# Patient Record
Sex: Male | Born: 1970 | Race: Black or African American | Hispanic: No | Marital: Single | Smoking: Former smoker
Health system: Southern US, Community
[De-identification: ages and names within clinical notes are randomized; demographics above are authoritative.]

## PROBLEM LIST (undated history)

## (undated) DIAGNOSIS — M549 Dorsalgia, unspecified: Secondary | ICD-10-CM

## (undated) DIAGNOSIS — G43909 Migraine, unspecified, not intractable, without status migrainosus: Secondary | ICD-10-CM

## (undated) DIAGNOSIS — J45909 Unspecified asthma, uncomplicated: Secondary | ICD-10-CM

## (undated) DIAGNOSIS — I10 Essential (primary) hypertension: Secondary | ICD-10-CM

## (undated) DIAGNOSIS — I639 Cerebral infarction, unspecified: Secondary | ICD-10-CM

## (undated) DIAGNOSIS — G8929 Other chronic pain: Secondary | ICD-10-CM

## (undated) DIAGNOSIS — I729 Aneurysm of unspecified site: Secondary | ICD-10-CM

## (undated) DIAGNOSIS — E7800 Pure hypercholesterolemia, unspecified: Secondary | ICD-10-CM

## (undated) HISTORY — PX: BACK SURGERY: SHX140

---

## 2011-07-30 ENCOUNTER — Encounter: Payer: Self-pay | Attending: Physical Medicine & Rehabilitation

## 2011-07-30 ENCOUNTER — Ambulatory Visit: Payer: Self-pay | Admitting: Physical Medicine & Rehabilitation

## 2012-01-23 ENCOUNTER — Encounter (HOSPITAL_COMMUNITY): Payer: Self-pay | Admitting: *Deleted

## 2012-01-23 ENCOUNTER — Emergency Department (HOSPITAL_COMMUNITY)
Admission: EM | Admit: 2012-01-23 | Discharge: 2012-01-23 | Disposition: A | Discharge status: Home / Self Care | Payer: Self-pay | Attending: Emergency Medicine | Admitting: Emergency Medicine

## 2012-01-23 DIAGNOSIS — M549 Dorsalgia, unspecified: Secondary | ICD-10-CM

## 2012-01-23 DIAGNOSIS — M545 Low back pain, unspecified: Secondary | ICD-10-CM | POA: Insufficient documentation

## 2012-01-23 DIAGNOSIS — Z8673 Personal history of transient ischemic attack (TIA), and cerebral infarction without residual deficits: Secondary | ICD-10-CM | POA: Insufficient documentation

## 2012-01-23 DIAGNOSIS — G8929 Other chronic pain: Secondary | ICD-10-CM | POA: Insufficient documentation

## 2012-01-23 DIAGNOSIS — Z87891 Personal history of nicotine dependence: Secondary | ICD-10-CM | POA: Insufficient documentation

## 2012-01-23 DIAGNOSIS — E7800 Pure hypercholesterolemia, unspecified: Secondary | ICD-10-CM | POA: Insufficient documentation

## 2012-01-23 DIAGNOSIS — I10 Essential (primary) hypertension: Secondary | ICD-10-CM | POA: Insufficient documentation

## 2012-01-23 DIAGNOSIS — J45909 Unspecified asthma, uncomplicated: Secondary | ICD-10-CM | POA: Insufficient documentation

## 2012-01-23 HISTORY — DX: Cerebral infarction, unspecified: I63.9

## 2012-01-23 HISTORY — DX: Migraine, unspecified, not intractable, without status migrainosus: G43.909

## 2012-01-23 HISTORY — DX: Unspecified asthma, uncomplicated: J45.909

## 2012-01-23 HISTORY — DX: Essential (primary) hypertension: I10

## 2012-01-23 HISTORY — DX: Pure hypercholesterolemia, unspecified: E78.00

## 2012-01-23 MED ORDER — HYDROCODONE-ACETAMINOPHEN 5-325 MG PO TABS: 2.0000 | ORAL_TABLET | Freq: Three times a day (TID) | ORAL | Status: AC | PRN

## 2012-01-23 MED ORDER — HYDROCODONE-ACETAMINOPHEN 5-325 MG PO TABS
1.0000 | ORAL_TABLET | Freq: Once | ORAL | Status: AC
  Administered 2012-01-23: 1 via ORAL
  Filled 2012-01-23: qty 1

## 2012-01-23 MED ORDER — DIAZEPAM 5 MG PO TABS: 5.0000 mg | ORAL_TABLET | Freq: Two times a day (BID) | ORAL | Status: AC

## 2012-01-23 MED ORDER — IBUPROFEN 800 MG PO TABS: 800.0000 mg | ORAL_TABLET | Freq: Three times a day (TID) | ORAL | Status: AC

## 2012-01-23 MED ORDER — DIAZEPAM 5 MG PO TABS
5.0000 mg | ORAL_TABLET | Freq: Once | ORAL | Status: AC
  Administered 2012-01-23: 5 mg via ORAL
  Filled 2012-01-23: qty 1

## 2012-01-23 MED ORDER — IBUPROFEN 400 MG PO TABS
800.0000 mg | ORAL_TABLET | Freq: Once | ORAL | Status: AC
  Administered 2012-01-23: 800 mg via ORAL
  Filled 2012-01-23: qty 2

## 2012-01-23 NOTE — ED Notes (Signed)
PT has had 2 back surgeries in lower back and in 2011 had stroke and aneurysm.  Pt states lower back pain has been worse over last 2 days and pain is running down legs.  Ambulatory.  No incontinence of bowel or bladder

## 2012-01-23 NOTE — ED Provider Notes (Signed)
History  Scribed for Gerhard Munch, MD, the patient was seen in room TR10C/TR10C. This chart was scribed by Candelaria Stagers. The patient's care started at 6:10 PM   CSN: [id number]  Arrival date & time 01/23/12  1640   First MD Initiated Contact with Patient 01/23/12 1755      Chief Complaint  Patient presents with  . Back Pain     The history is provided by the patient.   John Duffy is a 41 y.o. male who presents to the Emergency Department complaining of chronic lower back pain that became worse two days ago which is  radiating down both legs.  Pt denies new injury or trauma.  He has no incontinence of bowel or bladder, numbness, tingling, fever, or chills.  Pt has h/o 2 back surgeries, and stroke and aneurysm in 2011.  Pt has taken tramadol for the back pain with no relief.    Past Medical History  Diagnosis Date  . Stroke     brain aneurysm  . Hypertension   . Migraines   . Hypercholesteremia   . Asthma     Past Surgical History  Procedure Date  . Back surgery     No family history on file.  History  Substance Use Topics  . Smoking status: Former Games developer  . Smokeless tobacco: Not on file  . Alcohol Use: No      Review of Systems  Constitutional: Negative for fever and chills.       Per HPI, otherwise negative  HENT:       Per HPI, otherwise negative  Eyes: Negative.   Respiratory:       Per HPI, otherwise negative  Cardiovascular:       Per HPI, otherwise negative  Gastrointestinal: Negative for vomiting.  Genitourinary: Negative.  Negative for difficulty urinating.  Musculoskeletal: Positive for back pain.       Per HPI, otherwise negative  Skin: Negative.   Neurological: Negative for syncope, weakness and numbness.    Allergies  Review of patient's allergies indicates no known allergies.  Home Medications   Current Outpatient Rx  Name Route Sig Dispense Refill  . ASPIRIN EC 81 MG PO TBEC Oral Take 81 mg by mouth daily.    Marland Kitchen BACLOFEN 10 MG  PO TABS Oral Take 10 mg by mouth 3 (three) times daily.    Marland Kitchen LISINOPRIL-HYDROCHLOROTHIAZIDE 20-12.5 MG PO TABS Oral Take 1 tablet by mouth daily.    . TRAMADOL-ACETAMINOPHEN 37.5-325 MG PO TABS Oral Take 1 tablet by mouth every 6 (six) hours as needed. For pain      BP 160/114  Pulse 81  Temp 98.6 F (37 C) (Oral)  Resp 18  SpO2 99%  Physical Exam  Nursing note and vitals reviewed. Constitutional: He is oriented to person, place, and time. He appears well-developed. No distress.  HENT:  Head: Normocephalic and atraumatic.  Eyes: Conjunctivae and EOM are normal.  Cardiovascular: Normal rate and regular rhythm.   Pulmonary/Chest: Effort normal. No stridor. No respiratory distress.  Abdominal: He exhibits no distension.  Musculoskeletal: He exhibits no edema.       Mid lumbar tenderness.  Bilateral SI joint pain without deformity.  Strength of lower extremities intact.    Neurological: He is alert and oriented to person, place, and time.  Skin: Skin is warm and dry.  Psychiatric: He has a normal mood and affect.    ED Course  Procedures   DIAGNOSTIC STUDIES: Oxygen Saturation is 99% on  room air, normal by my interpretation.    COORDINATION OF CARE:     Labs Reviewed - No data to display No results found.   No diagnosis found.    MDM  I personally performed the services described in this documentation, which was scribed in my presence. The recorded information has been reviewed and considered.  This generally well-appearing male with a history of multiple lower spine procedures now presents with concerns of increasing low back pain with radiation down both legs.  On exam he is in no distress, with no focal neurologic deficiencies, and with vital signs notable only for mild hypertension.  The patient's history of chronic back pain, the preserved neurovascular status is reassuring.  He was provided an initial dose of analgesics, and discharged in stable condition to follow  up with his orthopedist.  Gerhard Munch, MD 01/23/12 1845

## 2012-07-30 ENCOUNTER — Emergency Department (HOSPITAL_COMMUNITY): Payer: Medicaid Other

## 2012-07-30 ENCOUNTER — Encounter (HOSPITAL_COMMUNITY): Payer: Self-pay | Admitting: Emergency Medicine

## 2012-07-30 ENCOUNTER — Emergency Department (HOSPITAL_COMMUNITY)
Admission: EM | Admit: 2012-07-30 | Discharge: 2012-07-30 | Disposition: A | Discharge status: Home Health | Payer: Medicaid Other | Attending: Emergency Medicine | Admitting: Emergency Medicine

## 2012-07-30 DIAGNOSIS — I10 Essential (primary) hypertension: Secondary | ICD-10-CM | POA: Insufficient documentation

## 2012-07-30 DIAGNOSIS — IMO0002 Reserved for concepts with insufficient information to code with codable children: Secondary | ICD-10-CM | POA: Insufficient documentation

## 2012-07-30 DIAGNOSIS — W010XXA Fall on same level from slipping, tripping and stumbling without subsequent striking against object, initial encounter: Secondary | ICD-10-CM | POA: Insufficient documentation

## 2012-07-30 DIAGNOSIS — Y939 Activity, unspecified: Secondary | ICD-10-CM | POA: Insufficient documentation

## 2012-07-30 DIAGNOSIS — Z87891 Personal history of nicotine dependence: Secondary | ICD-10-CM | POA: Insufficient documentation

## 2012-07-30 DIAGNOSIS — Z7982 Long term (current) use of aspirin: Secondary | ICD-10-CM | POA: Insufficient documentation

## 2012-07-30 DIAGNOSIS — Z8679 Personal history of other diseases of the circulatory system: Secondary | ICD-10-CM | POA: Insufficient documentation

## 2012-07-30 DIAGNOSIS — M549 Dorsalgia, unspecified: Secondary | ICD-10-CM

## 2012-07-30 DIAGNOSIS — Z79899 Other long term (current) drug therapy: Secondary | ICD-10-CM | POA: Insufficient documentation

## 2012-07-30 DIAGNOSIS — Z8673 Personal history of transient ischemic attack (TIA), and cerebral infarction without residual deficits: Secondary | ICD-10-CM | POA: Insufficient documentation

## 2012-07-30 DIAGNOSIS — E7800 Pure hypercholesterolemia, unspecified: Secondary | ICD-10-CM | POA: Insufficient documentation

## 2012-07-30 DIAGNOSIS — Y929 Unspecified place or not applicable: Secondary | ICD-10-CM | POA: Insufficient documentation

## 2012-07-30 DIAGNOSIS — J45909 Unspecified asthma, uncomplicated: Secondary | ICD-10-CM | POA: Insufficient documentation

## 2012-07-30 MED ORDER — HYDROCODONE-ACETAMINOPHEN 5-325 MG PO TABS: 2.0000 | ORAL_TABLET | ORAL | Status: DC | PRN

## 2012-07-30 MED ORDER — KETOROLAC TROMETHAMINE 60 MG/2ML IM SOLN
60.0000 mg | Freq: Once | INTRAMUSCULAR | Status: AC
  Administered 2012-07-30: 60 mg via INTRAMUSCULAR
  Filled 2012-07-30: qty 2

## 2012-07-30 MED ORDER — METHOCARBAMOL 500 MG PO TABS: 500.0000 mg | ORAL_TABLET | Freq: Two times a day (BID) | ORAL | Status: DC

## 2012-07-30 NOTE — ED Provider Notes (Signed)
History     CSN: [id number]  Arrival date & time 07/30/12  7829   First MD Initiated Contact with Patient 07/30/12 [phone]      Chief Complaint  Patient presents with  . Fall  . Back Pain    (Consider location/radiation/quality/duration/timing/severity/associated sxs/prior treatment) HPI Comments: This is a 42 year old male, who presents emergency department with chief complaint low back pain. Patient states that he slipped and fell on Friday. States that he is having back pain, that is radiating down his legs. He denies any bowel or bladder incontinence. He denies any saddle anesthesia. He has not tried taking anything to alleviate his symptoms. Bending and movement makes the patient's symptoms worse, nothing makes it better. States that his pain is moderate to severe.  The history is provided by the patient. No language interpreter was used.    Past Medical History  Diagnosis Date  . Stroke     brain aneurysm  . Hypertension   . Migraines   . Hypercholesteremia   . Asthma     Past Surgical History  Procedure Laterality Date  . Back surgery      No family history on file.  History  Substance Use Topics  . Smoking status: Former Games developer  . Smokeless tobacco: Not on file  . Alcohol Use: No      Review of Systems  All other systems reviewed and are negative.    Allergies  Review of patient's allergies indicates no known allergies.  Home Medications   Current Outpatient Rx  Name  Route  Sig  Dispense  Refill  . aspirin 81 MG chewable tablet   Oral   Chew 81 mg by mouth daily with breakfast.         . atorvastatin (LIPITOR) 10 MG tablet   Oral   Take 10 mg by mouth daily.         . Nebivolol HCl (BYSTOLIC PO)   Oral   Take 1 tablet by mouth daily with breakfast.         . HYDROcodone-acetaminophen (NORCO/VICODIN) 5-325 MG per tablet   Oral   Take 2 tablets by mouth every 4 (four) hours as needed for pain.   10 tablet   0   . methocarbamol  (ROBAXIN) 500 MG tablet   Oral   Take 1 tablet (500 mg total) by mouth 2 (two) times daily.   20 tablet   0     BP 122/91  Pulse 67  Temp(Src) 98.2 F (36.8 C) (Oral)  Resp 20  SpO2 100%  Physical Exam  Nursing note and vitals reviewed. Constitutional: He is oriented to person, place, and time. He appears well-developed and well-nourished.  HENT:  Head: Normocephalic and atraumatic.  Neck: Normal range of motion. Neck supple. No JVD present.  Cardiovascular: Normal rate, regular rhythm, normal heart sounds and intact distal pulses.  Exam reveals no gallop and no friction rub.   No murmur heard. Pulmonary/Chest: Effort normal and breath sounds normal. No respiratory distress. He has no wheezes. He has no rales. He exhibits no tenderness.  Abdominal: Soft. Bowel sounds are normal. He exhibits no distension and no mass. There is no tenderness. There is no rebound and no guarding.  Musculoskeletal: Normal range of motion. He exhibits tenderness. He exhibits no edema.  Lumbar spine and lumbar paraspinal muscles are moderately tender to palpation, no gross abnormality or deformity  Neurological: He is alert and oriented to person, place, and time.  Skin: Skin is  warm and dry.  Psychiatric: He has a normal mood and affect. His behavior is normal. Judgment and thought content normal.    ED Course  Procedures (including critical care time)  Labs Reviewed - No data to display Dg Lumbar Spine Complete  07/30/2012  *RADIOLOGY REPORT*  Clinical Data: Fall with low back pain.  LUMBAR SPINE - COMPLETE 4+ VIEW  Comparison: None  Findings: Five non-rib bearing lumbar vertebra are identified in normal alignment. There is no evidence of fracture or subluxation. Mild degenerative disc disease at L5-S1 noted. The disc spaces are otherwise maintained. No focal bony lesions or spondylolysis identified.  IMPRESSION: No evidence of acute bony abnormality.  Mild L5-S1 degenerative disc disease.   Original  Report Authenticated By: Harmon Pier, M.D.    Dg Sacrum/coccyx  07/30/2012  *RADIOLOGY REPORT*  Clinical Data: Sacrum and coccyx pain following fall.  SACRUM AND COCCYX - 2+ VIEW  Comparison: None  Findings: There is no evidence of fracture. No focal bony lesions are present. No bony abnormalities are present.  IMPRESSION: No evidence of acute bony abnormality.   Original Report Authenticated By: Harmon Pier, M.D.      1. Back pain       MDM  42 year old male with back pain. Patient given a portal shot in the emergency department. Patient states that his pain has improved significantly while in the emergency department. Will discharge the patient with pain medicine and Robaxin. Will have the patient followup with his primary care provider. Doubt any cauda equina, or other serious back problems at this time. Patient is stable and ready for discharge.        Roxy Horseman, PA-C 07/30/12 1233

## 2012-07-30 NOTE — ED Notes (Signed)
Pt discharged to home with family. NAD.  

## 2012-07-30 NOTE — ED Provider Notes (Signed)
Medical screening examination/treatment/procedure(s) were performed by non-physician practitioner and as supervising physician I was immediately available for consultation/collaboration.  Marwan T Powers, MD 07/30/12 1511 

## 2012-07-30 NOTE — ED Notes (Signed)
Pt slipped and fell Friday. Pt c/o low back pain that radiates down both legs. Pt denies +LOC.

## 2012-08-07 ENCOUNTER — Encounter: Payer: Self-pay | Admitting: Physical Medicine & Rehabilitation

## 2012-08-22 ENCOUNTER — Encounter: Payer: Medicaid Other | Attending: Physical Medicine & Rehabilitation

## 2012-08-22 ENCOUNTER — Ambulatory Visit (HOSPITAL_BASED_OUTPATIENT_CLINIC_OR_DEPARTMENT_OTHER): Payer: Medicaid Other | Admitting: Physical Medicine & Rehabilitation

## 2012-08-22 ENCOUNTER — Encounter: Payer: Self-pay | Admitting: Physical Medicine & Rehabilitation

## 2012-08-22 VITALS — BP 171/108 | HR 81 | Resp 14 | Ht 69.0 in | Wt 182.0 lb

## 2012-08-22 DIAGNOSIS — M549 Dorsalgia, unspecified: Secondary | ICD-10-CM

## 2012-08-22 DIAGNOSIS — Z5181 Encounter for therapeutic drug level monitoring: Secondary | ICD-10-CM

## 2012-08-22 DIAGNOSIS — M961 Postlaminectomy syndrome, not elsewhere classified: Secondary | ICD-10-CM

## 2012-08-22 DIAGNOSIS — M47817 Spondylosis without myelopathy or radiculopathy, lumbosacral region: Secondary | ICD-10-CM

## 2012-08-22 DIAGNOSIS — M79609 Pain in unspecified limb: Secondary | ICD-10-CM

## 2012-08-22 DIAGNOSIS — G8928 Other chronic postprocedural pain: Secondary | ICD-10-CM | POA: Insufficient documentation

## 2012-08-22 MED ORDER — BACLOFEN 10 MG PO TABS: 10.0000 mg | ORAL_TABLET | Freq: Three times a day (TID) | ORAL | Status: DC

## 2012-08-22 MED ORDER — MELOXICAM 7.5 MG PO TABS: 7.5000 mg | ORAL_TABLET | Freq: Every day | ORAL | Status: DC

## 2012-08-22 NOTE — Progress Notes (Signed)
Subjective:    Patient ID: John Duffy, male    DOB: Feb 25, 1971, 42 y.o.   MRN: [id number]  HPI Chief complaint back pain and left lower extremity pain, work related injury, had PT and surgery, June 2013  Most recent MRI from 2013 showed postsurgical changes degenerative changes small right-sided paracentral protrusion which may be scar possible right S1 encroachment. Date of MRI 04/06/2012 also showed facet hypertrophy, arthritic changes mild at L2-3 mild L3-4 mild at L4-5 and moderate at L5-S1 note reviewed from PCP, notes from [facility]. Past surgical history laminotomy 11/30/2010 L5-S1 as well as nerve root decompression partial facetectomy and partial disc removal PMH: Aneurysm rupture 2011 resulting in right-sided weakness upper extremity greater than lower also with right eye watering, Hypertension poorly controlled Opioid risk total score of 10 which is elevated with high likelihood of problems with chronic opioid analgesics Pain Inventory Average Pain 10 Pain Right Now 8 My pain is sharp, stabbing, tingling and aching  In the last 24 hours, has pain interfered with the following? General activity 10 Relation with others 10 Enjoyment of life 10 What TIME of day is your pain at its worst? morning Sleep (in general) Poor  Pain is worse with: walking, bending, sitting, inactivity, standing and some activites Pain improves with: rest and medication Relief from Meds: 10  Mobility use a cane how many minutes can you walk? 3 ability to climb steps?  yes do you drive?  no Do you have any goals in this area?  yes  Function disabled: date disabled 2011 I need assistance with the following:  bathing, household duties and shopping  Neuro/Psych weakness trouble walking spasms depression  Prior Studies CT/MRI  Physicians involved in your care Primary care [facility]   Family History  Problem Relation Age of Onset  . Diabetes  Mother   . Hypertension Mother    History   Social History  . Marital Status: Single    Spouse Name: N/A    Number of Children: N/A  . Years of Education: N/A   Social History Main Topics  . Smoking status: Former Games developer  . Smokeless tobacco: None  . Alcohol Use: No  . Drug Use: No  . Sexually Active:    Other Topics Concern  . None   Social History Narrative  . None   Past Surgical History  Procedure Laterality Date  . Back surgery     Past Medical History  Diagnosis Date  . Stroke     brain aneurysm  . Hypertension   . Migraines   . Hypercholesteremia   . Asthma    BP 167/120  Pulse 81  Resp 14  Ht 5\' 9"  (1.753 m)  Wt 182 lb (82.555 kg)  BMI 26.86 kg/m2  SpO2 97%     Review of Systems  Musculoskeletal: Positive for back pain and gait problem.  Neurological: Positive for weakness.  Psychiatric/Behavioral: Positive for dysphoric mood.  All other systems reviewed and are negative.       Objective:   Physical Exam  Nursing note and vitals reviewed. Constitutional: He is oriented to person, place, and time. He appears well-developed and well-nourished.  HENT:  Head: Normocephalic and atraumatic.  Eyes: Conjunctivae and EOM are normal. Pupils are equal, round, and reactive to light.  Neck: Normal range of motion.  Cardiovascular: Normal rate, regular rhythm, normal heart sounds and intact distal pulses.   Pulmonary/Chest: Effort normal and breath sounds normal.  Abdominal: Soft. Bowel sounds  are normal.  Musculoskeletal:       Lumbar back: He exhibits decreased range of motion, tenderness, pain and spasm. He exhibits no swelling, no edema, no deformity and no laceration.  Midline surgical scar well-healed in the lumbar area Limited range of motion in all directions in the lumbar spine  Neurological: He is alert and oriented to person, place, and time. He has normal reflexes.  Psychiatric: His affect is blunt.  Positive Waddell sign with axial  pressure on head as well as pain with minimal palpation of the lumbar area          Assessment & Plan:  1. 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 This is a mixture between disc pain, muscle pain from scarring. There is some residual radicular discomfort but nothing on the most recent MRI to indicate a new disc that can be causing his left leg symptoms. 2. Lumbar spondylosis evidence of arthritis in the lumbar area that can be contributing to his back pain. Will refer to physical therapy Will schedule lumbar medial branch blocks Will increase baclofen to 10 mg 3 times a day he only takes twice a day,If he fails this may try Zanaflex Add Mobic Nonnarcotic treatment secondary to elevated opioid risk tool score Recheck blood pressure if still elevated call PCP

## 2012-08-22 NOTE — Patient Instructions (Addendum)
Referral to physical therapy Schedule for lumbar injections to help with arthritis Recheck blood pressure still up 171/108 call your  physician Increase baclofen to 3 times a day Start Mobic 7.5 mg a day

## 2012-08-24 ENCOUNTER — Telehealth: Payer: Self-pay

## 2012-08-24 NOTE — Telephone Encounter (Signed)
[name] patients PCP called to get update on appointment and treatment.  Please call her.

## 2012-08-24 NOTE — Telephone Encounter (Signed)
[name] called to let us know that she refilled patients percocet.  This is the last time she says she will fill this.  Patient is aware he will be non-narcotic with our office.

## 2012-08-24 NOTE — Telephone Encounter (Signed)
Left message for John Duffy to call regarding her questions regarding patient.

## 2012-09-14 ENCOUNTER — Ambulatory Visit: Payer: Medicaid Other | Admitting: Physical Medicine & Rehabilitation

## 2012-09-14 ENCOUNTER — Encounter: Payer: Medicaid Other | Attending: Physical Medicine & Rehabilitation

## 2012-09-20 ENCOUNTER — Other Ambulatory Visit: Payer: Self-pay | Admitting: Otolaryngology

## 2013-04-28 ENCOUNTER — Emergency Department (HOSPITAL_COMMUNITY)
Admission: EM | Admit: 2013-04-28 | Discharge: 2013-04-28 | Disposition: A | Discharge status: Home / Self Care | Payer: Medicare PPO | Attending: Emergency Medicine | Admitting: Emergency Medicine

## 2013-04-28 ENCOUNTER — Encounter (HOSPITAL_COMMUNITY): Payer: Self-pay | Admitting: Emergency Medicine

## 2013-04-28 DIAGNOSIS — IMO0002 Reserved for concepts with insufficient information to code with codable children: Secondary | ICD-10-CM | POA: Insufficient documentation

## 2013-04-28 DIAGNOSIS — G43909 Migraine, unspecified, not intractable, without status migrainosus: Secondary | ICD-10-CM | POA: Insufficient documentation

## 2013-04-28 DIAGNOSIS — J45909 Unspecified asthma, uncomplicated: Secondary | ICD-10-CM | POA: Insufficient documentation

## 2013-04-28 DIAGNOSIS — M545 Low back pain, unspecified: Secondary | ICD-10-CM | POA: Insufficient documentation

## 2013-04-28 DIAGNOSIS — Z87891 Personal history of nicotine dependence: Secondary | ICD-10-CM | POA: Insufficient documentation

## 2013-04-28 DIAGNOSIS — Z9889 Other specified postprocedural states: Secondary | ICD-10-CM | POA: Insufficient documentation

## 2013-04-28 DIAGNOSIS — G8929 Other chronic pain: Secondary | ICD-10-CM | POA: Insufficient documentation

## 2013-04-28 DIAGNOSIS — Z7982 Long term (current) use of aspirin: Secondary | ICD-10-CM | POA: Insufficient documentation

## 2013-04-28 DIAGNOSIS — Z791 Long term (current) use of non-steroidal anti-inflammatories (NSAID): Secondary | ICD-10-CM | POA: Insufficient documentation

## 2013-04-28 DIAGNOSIS — Z79899 Other long term (current) drug therapy: Secondary | ICD-10-CM | POA: Insufficient documentation

## 2013-04-28 DIAGNOSIS — E7800 Pure hypercholesterolemia, unspecified: Secondary | ICD-10-CM | POA: Insufficient documentation

## 2013-04-28 DIAGNOSIS — Z8673 Personal history of transient ischemic attack (TIA), and cerebral infarction without residual deficits: Secondary | ICD-10-CM | POA: Insufficient documentation

## 2013-04-28 DIAGNOSIS — I10 Essential (primary) hypertension: Secondary | ICD-10-CM | POA: Insufficient documentation

## 2013-04-28 MED ORDER — HYDROMORPHONE HCL PF 2 MG/ML IJ SOLN
2.0000 mg | Freq: Once | INTRAMUSCULAR | Status: AC
  Administered 2013-04-28: 2 mg via INTRAMUSCULAR
  Filled 2013-04-28: qty 1

## 2013-04-28 NOTE — ED Provider Notes (Signed)
CSN: [id number]     Arrival date & time 04/28/13  0705 History   First MD Initiated Contact with Patient 04/28/13 [phone]     Chief Complaint  Patient presents with  . Back Pain   (Consider location/radiation/quality/duration/timing/severity/associated sxs/prior Treatment) Patient is a 42 y.o. male presenting with back pain.  Back Pain  Pt with history of chronic back pain, currently under care of [facility] ran out of his percocet several days ago and has had exacerbation of his chronic bilateral lower back pain. Pain is severe aching and radiating down his legs. He states he was seen in PCP office yesterday and told to 'go to the ER' for pain control.   Past Medical History  Diagnosis Date  . Stroke     brain aneurysm  . Hypertension   . Migraines   . Hypercholesteremia   . Asthma    Past Surgical History  Procedure Laterality Date  . Back surgery     Family History  Problem Relation Age of Onset  . Diabetes Mother   . Hypertension Mother    History  Substance Use Topics  . Smoking status: Former Smoker    Types: Cigarettes  . Smokeless tobacco: Not on file  . Alcohol Use: No    Review of Systems  Musculoskeletal: Positive for back pain.   All other systems reviewed and are negative except as noted in HPI.   Allergies  Review of patient's allergies indicates no known allergies.  Home Medications   Current Outpatient Rx  Name  Route  Sig  Dispense  Refill  . aspirin 81 MG chewable tablet   Oral   Chew 81 mg by mouth daily with breakfast.         . atorvastatin (LIPITOR) 10 MG tablet   Oral   Take 10 mg by mouth daily.         . baclofen (LIORESAL) 10 MG tablet   Oral   Take 1 tablet (10 mg total) by mouth 3 (three) times daily.   90 each   1   . fluticasone (FLONASE) 50 MCG/ACT nasal spray   Nasal   Place 2 sprays into the nose daily.         Marland Kitchen lisinopril-hydrochlorothiazide (PRINZIDE,ZESTORETIC) 20-12.5 MG per tablet   Oral   Take  1 tablet by mouth daily.         . meloxicam (MOBIC) 7.5 MG tablet   Oral   Take 1 tablet (7.5 mg total) by mouth daily.   30 tablet   1   . Nebivolol HCl (BYSTOLIC PO)   Oral   Take 1 tablet by mouth daily with breakfast.         . oxyCODONE-acetaminophen (PERCOCET) 7.5-325 MG per tablet   Oral   Take 1 tablet by mouth 3 (three) times daily as needed for pain. Quantity 90 per [name] given 08/23/12          BP 134/94  Pulse 85  Temp(Src) 98 F (36.7 C) (Oral)  Resp 18  SpO2 96% Physical Exam  Nursing note and vitals reviewed. Constitutional: He is oriented to person, place, and time. He appears well-developed and well-nourished.  HENT:  Head: Normocephalic and atraumatic.  Eyes: EOM are normal. Pupils are equal, round, and reactive to light.  Neck: Normal range of motion. Neck supple.  Cardiovascular: Normal rate, normal heart sounds and intact distal pulses.   Pulmonary/Chest: Effort normal and breath sounds normal.  Abdominal: Bowel sounds  are normal. He exhibits no distension. There is no tenderness.  Musculoskeletal: Normal range of motion. He exhibits tenderness (diffuse lumbar spine). He exhibits no edema.  Neurological: He is alert and oriented to person, place, and time. He has normal strength. No cranial nerve deficit or sensory deficit.  Skin: Skin is warm and dry. No rash noted.  Psychiatric: He has a normal mood and affect.    ED Course  Procedures (including critical care time) Labs Review Labs Reviewed - No data to display Imaging Review No results found.  EKG Interpretation   None       MDM   1. Chronic back pain     Pt given IM pain med, advised no Rx from ED. PCP and Pain management followup.     Charles B. Bernette Mayers, MD 04/28/13 [phone]

## 2013-04-28 NOTE — ED Notes (Signed)
Pt c/o lower back pain that radiates down bilateral leg. Hx of 2 back surgeries in the past and stated that he has a herniated disk that they are wanting to do a third surgery on. Stated that the pain became worse last nigh. Pt is currently out of pain medication and PCP to him to come to ED.

## 2013-10-30 ENCOUNTER — Emergency Department (HOSPITAL_COMMUNITY)
Admission: EM | Admit: 2013-10-30 | Discharge: 2013-10-30 | Disposition: A | Discharge status: Home / Self Care | Payer: Medicare PPO | Attending: Emergency Medicine | Admitting: Emergency Medicine

## 2013-10-30 ENCOUNTER — Encounter (HOSPITAL_COMMUNITY): Payer: Self-pay | Admitting: Emergency Medicine

## 2013-10-30 DIAGNOSIS — G43909 Migraine, unspecified, not intractable, without status migrainosus: Secondary | ICD-10-CM | POA: Insufficient documentation

## 2013-10-30 DIAGNOSIS — Z7982 Long term (current) use of aspirin: Secondary | ICD-10-CM | POA: Insufficient documentation

## 2013-10-30 DIAGNOSIS — Z8639 Personal history of other endocrine, nutritional and metabolic disease: Secondary | ICD-10-CM | POA: Insufficient documentation

## 2013-10-30 DIAGNOSIS — Z79899 Other long term (current) drug therapy: Secondary | ICD-10-CM | POA: Insufficient documentation

## 2013-10-30 DIAGNOSIS — Z8673 Personal history of transient ischemic attack (TIA), and cerebral infarction without residual deficits: Secondary | ICD-10-CM | POA: Insufficient documentation

## 2013-10-30 DIAGNOSIS — J45909 Unspecified asthma, uncomplicated: Secondary | ICD-10-CM | POA: Insufficient documentation

## 2013-10-30 DIAGNOSIS — Z87891 Personal history of nicotine dependence: Secondary | ICD-10-CM | POA: Insufficient documentation

## 2013-10-30 DIAGNOSIS — R519 Headache, unspecified: Secondary | ICD-10-CM

## 2013-10-30 DIAGNOSIS — I10 Essential (primary) hypertension: Secondary | ICD-10-CM | POA: Insufficient documentation

## 2013-10-30 DIAGNOSIS — R51 Headache: Secondary | ICD-10-CM

## 2013-10-30 DIAGNOSIS — Z862 Personal history of diseases of the blood and blood-forming organs and certain disorders involving the immune mechanism: Secondary | ICD-10-CM | POA: Insufficient documentation

## 2013-10-30 MED ORDER — KETOROLAC TROMETHAMINE 30 MG/ML IJ SOLN
30.0000 mg | Freq: Once | INTRAMUSCULAR | Status: AC
  Administered 2013-10-30: 30 mg via INTRAVENOUS
  Filled 2013-10-30: qty 1

## 2013-10-30 MED ORDER — PROCHLORPERAZINE EDISYLATE 5 MG/ML IJ SOLN
10.0000 mg | Freq: Once | INTRAMUSCULAR | Status: AC
  Administered 2013-10-30: 10 mg via INTRAVENOUS
  Filled 2013-10-30: qty 2

## 2013-10-30 MED ORDER — DIPHENHYDRAMINE HCL 50 MG/ML IJ SOLN
25.0000 mg | Freq: Once | INTRAMUSCULAR | Status: AC
  Administered 2013-10-30: 25 mg via INTRAVENOUS
  Filled 2013-10-30: qty 1

## 2013-10-30 NOTE — ED Provider Notes (Signed)
CSN: [id number]     Arrival date & time 10/30/13  [id number] History   First MD Initiated Contact with Patient 10/30/13 [phone]     Chief Complaint  Patient presents with  . Migraine     (Consider location/radiation/quality/duration/timing/severity/associated sxs/prior Treatment) HPI  John Duffy is a(n) 43 y.o. male who presents to the ed with cc of HA. 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 H has  Chronic back pain and HA and is in pain management. He takes no long term opiates. The pateitn c/o HA that began Friday. Pain is currently 10/10, it has progressively worsened, he had a shot of tordadol yesterday without relief. He has tried resting in the dark without relief. Associated sxs include photophobia, nausea nad vomiting. Pain is located in the top of the head and the frontal region. He also c/o pain in the L paracervical spinal muscles. This pain is worse with LR of the head to the right. Denies phonophobia, UL throbbing,  visual changes, stiff neck, rash, or "thunderclap" onset. Denies changes or worsening of his unilateral weakness and facial  Asymmetry. Denies difficulty with speech, change in gait, or vertigo. His pain is like his regular headache only worse and lasting longer than normal.    Past Medical History  Diagnosis Date  . Stroke     brain aneurysm  . Hypertension   . Migraines   . Hypercholesteremia   . Asthma    Past Surgical History  Procedure Laterality Date  . Back surgery     Family History  Problem Relation Age of Onset  . Diabetes Mother   . Hypertension Mother    History  Substance Use Topics  . Smoking status: Former Smoker    Types: Cigarettes  . Smokeless tobacco: Not on file  . Alcohol Use: No    Review of Systems  Ten systems reviewed and are negative for acute change, except as noted in the HPI. \   Allergies  Review of patient's allergies indicates no known allergies.  Home Medications   Prior to Admission  medications   Medication Sig Start Date End Date Taking? Authorizing Provider  aspirin 81 MG chewable tablet Chew 81 mg by mouth daily with breakfast.   Yes Historical Provider, MD  lisinopril-hydrochlorothiazide (PRINZIDE,ZESTORETIC) 20-12.5 MG per tablet Take 1 tablet by mouth daily.   Yes Historical Provider, MD  nebivolol (BYSTOLIC) 5 MG tablet Take 5 mg by mouth daily.   Yes Historical Provider, MD  oxyCODONE-acetaminophen (PERCOCET) 7.5-325 MG per tablet Take 1 tablet by mouth 3 (three) times daily as needed for pain.     Historical Provider, MD   BP 141/82  Pulse 68  Temp(Src) 98 F (36.7 C) (Oral)  Resp 18  Ht 5\' 9"  (1.753 m)  Wt 180 lb (81.647 kg)  BMI 26.57 kg/m2  SpO2 98% Physical Exam Physical Exam  Nursing note and vitals reviewed. Constitutional: He appears well-developed and well-nourished. No distress.  HENT:  Head: Normocephalic and atraumatic.  Eyes: Conjunctivae normal are normal. No scleral icterus.  Neck: Normal range of motion. Neck supple.  Cardiovascular: Normal rate, regular rhythm and normal heart sounds.   Pulmonary/Chest: Effort normal and breath sounds normal. No respiratory distress.  Abdominal: Soft. There is no tenderness.  Musculoskeletal: He exhibits no edema.  Neurological: He is alert.  is oriented to person place time and event. Speech is clear and goal oriented. Cranial nerves 2 -12 grossly intact with some mild right-sided facial hemiparesis  and slight lid ptosis. 5 out of 5 strength on the left upper and lower extremity, 4 out of 4 strength on the right upper and lower extremity. Deep tendon reflexes are intact, sensation intact. Moves extremities without ataxia. Rapid alternating movements equal bilaterally. Skin: Skin is warm and dry. He is not diaphoretic.  Musculoskeletal: Tender to palpation left paracervical spinal muscles. Pain with right lateral rotation of the head.  Psychiatric: His behavior is normal.    ED Course  Procedures  (including critical care time) Labs Review Labs Reviewed - No data to display  Imaging Review No results found.   EKG Interpretation None      MDM   Final diagnoses:  None   7:43 AM Filed Vitals:   10/30/13 0659  BP: 141/82  Pulse: 68  Temp: 98 F (36.7 C)  Resp: 18    Patient with intractable HA. Will begin with migraine cocktail. The patient has no focal neurologic deficits. He did not have concerned for subarachnoid hemorrhage, aneurysm or other emergent cause of the patient's headache today. I do believe that there is some musculoskeletal involvement as the patient is extremely tender to palpation of the left paracervical spinal muscles.  Pt HA treated and improved while in ED.  Presentation is like pts typical HA and non concerning [facility], ICH, Meningitis, or temporal arteritis.  On repeat exam there are no focal neuro deficits. Pt is afebrile with no focal neuro deficits, nuchal rigidity, or change in vision. Pt is to follow up with PCP to discuss prophylactic medication. Pt verbalizes understanding and is agreeable with plan to dc.    Arthor Captainbigail Marcellis Frampton, PA-C 10/30/13 2130

## 2013-10-30 NOTE — Discharge Instructions (Signed)
You are having a headache. No specific cause was found today for your headache. It may have been a migraine or other cause of headache. Stress, anxiety, fatigue, and depression are common triggers for headaches. Your headache today does not appear to be life-threatening or require hospitalization, but often the exact cause of headaches is not determined in the emergency department. Therefore, follow-up with your doctor is very important to find out what may have caused your headache, and whether or not you need any further diagnostic testing or treatment. Sometimes headaches can appear benign (not harmful), but then more serious symptoms can develop which should prompt an immediate re-evaluation by your doctor or the emergency department. SEEK MEDICAL ATTENTION IF: You develop possible problems with medications prescribed.  The medications don't resolve your headache, if it recurs , or if you have multiple episodes of vomiting or can't take fluids. You have a change from the usual headache. RETURN IMMEDIATELY IF you develop a sudden, severe headache or confusion, become poorly responsive or faint, develop a fever above 100.33F or problem breathing, have a change in speech, vision, swallowing, or understanding, or develop new weakness, numbness, tingling, incoordination, or have a seizure.   Migraine Headache A migraine headache is an intense, throbbing pain on one or both sides of your head. A migraine can last for 30 minutes to several hours. CAUSES  The exact cause of a migraine headache is not always known. However, a migraine may be caused when nerves in the brain become irritated and release chemicals that cause inflammation. This causes pain. Certain things may also trigger migraines, such as:  Alcohol.  Smoking.  Stress.  Menstruation.  Aged cheeses.  Foods or drinks that contain nitrates, glutamate, aspartame, or tyramine.  Lack of  sleep.  Chocolate.  Caffeine.  Hunger.  Physical exertion.  Fatigue.  Medicines used to treat chest pain (nitroglycerine), birth control pills, estrogen, and some blood pressure medicines. SIGNS AND SYMPTOMS  Pain on one or both sides of your head.  Pulsating or throbbing pain.  Severe pain that prevents daily activities.  Pain that is aggravated by any physical activity.  Nausea, vomiting, or both.  Dizziness.  Pain with exposure to bright lights, loud noises, or activity.  General sensitivity to bright lights, loud noises, or smells. Before you get a migraine, you may get warning signs that a migraine is coming (aura). An aura may include:  Seeing flashing lights.  Seeing bright spots, halos, or zig-zag lines.  Having tunnel vision or blurred vision.  Having feelings of numbness or tingling.  Having trouble talking.  Having muscle weakness. DIAGNOSIS  A migraine headache is often diagnosed based on:  Symptoms.  Physical exam.  A CT scan or MRI of your head. These imaging tests cannot diagnose migraines, but they can help rule out other causes of headaches. TREATMENT Medicines may be given for pain and nausea. Medicines can also be given to help prevent recurrent migraines.  HOME CARE INSTRUCTIONS  Only take over-the-counter or prescription medicines for pain or discomfort as directed by your health care provider. The use of long-term narcotics is not recommended.  Lie down in a dark, quiet room when you have a migraine.  Keep a journal to find out what may trigger your migraine headaches. For example, write down:  What you eat and drink.  How much sleep you get.  Any change to your diet or medicines.  Limit alcohol consumption.  Quit smoking if you smoke.  Get 7 9  hours of sleep, or as recommended by your health care provider. °· Limit stress. °· Keep lights dim if bright lights bother you and make your migraines worse. °SEEK IMMEDIATE MEDICAL  CARE IF:  °· Your migraine becomes severe. °· You have a fever. °· You have a stiff neck. °· You have vision loss. °· You have muscular weakness or loss of muscle control. °· You start losing your balance or have trouble walking. °· You feel faint or pass out. °· You have severe symptoms that are different from your first symptoms. °MAKE SURE YOU:  °· Understand these instructions. °· Will watch your condition. °· Will get help right away if you are not doing well or get worse. °Document Released: 05/31/2005 Document Revised: 03/21/2013 Document Reviewed: 02/05/2013 °[employer]® Patient Information ©2014 [employer]. ° °

## 2013-10-30 NOTE — ED Notes (Signed)
Pt reports migraine which began last Friday and gradually worsened. Pt went to PCP yesterday and was "given a shot". Pt reports that did not help and was told by his PCP to come to the emergency room if he did not receive any relief. Pt has hx of stroke and aneurism in 2011.

## 2013-11-03 ENCOUNTER — Encounter (HOSPITAL_COMMUNITY): Payer: Self-pay | Admitting: Emergency Medicine

## 2013-11-03 ENCOUNTER — Emergency Department (HOSPITAL_COMMUNITY)
Admission: EM | Admit: 2013-11-03 | Discharge: 2013-11-03 | Disposition: A | Discharge status: Home / Self Care | Payer: Medicare PPO | Attending: Emergency Medicine | Admitting: Emergency Medicine

## 2013-11-03 DIAGNOSIS — Z862 Personal history of diseases of the blood and blood-forming organs and certain disorders involving the immune mechanism: Secondary | ICD-10-CM | POA: Insufficient documentation

## 2013-11-03 DIAGNOSIS — I10 Essential (primary) hypertension: Secondary | ICD-10-CM | POA: Insufficient documentation

## 2013-11-03 DIAGNOSIS — G8911 Acute pain due to trauma: Secondary | ICD-10-CM | POA: Insufficient documentation

## 2013-11-03 DIAGNOSIS — Z7982 Long term (current) use of aspirin: Secondary | ICD-10-CM | POA: Insufficient documentation

## 2013-11-03 DIAGNOSIS — Z8639 Personal history of other endocrine, nutritional and metabolic disease: Secondary | ICD-10-CM | POA: Insufficient documentation

## 2013-11-03 DIAGNOSIS — M545 Low back pain, unspecified: Secondary | ICD-10-CM | POA: Insufficient documentation

## 2013-11-03 DIAGNOSIS — Z79899 Other long term (current) drug therapy: Secondary | ICD-10-CM | POA: Insufficient documentation

## 2013-11-03 DIAGNOSIS — J45909 Unspecified asthma, uncomplicated: Secondary | ICD-10-CM | POA: Insufficient documentation

## 2013-11-03 DIAGNOSIS — Z87891 Personal history of nicotine dependence: Secondary | ICD-10-CM | POA: Insufficient documentation

## 2013-11-03 DIAGNOSIS — S39012A Strain of muscle, fascia and tendon of lower back, initial encounter: Secondary | ICD-10-CM

## 2013-11-03 DIAGNOSIS — Z8673 Personal history of transient ischemic attack (TIA), and cerebral infarction without residual deficits: Secondary | ICD-10-CM | POA: Insufficient documentation

## 2013-11-03 MED ORDER — OXYCODONE-ACETAMINOPHEN 5-325 MG PO TABS
1.0000 | ORAL_TABLET | Freq: Once | ORAL | Status: AC
  Administered 2013-11-03: 1 via ORAL
  Filled 2013-11-03: qty 1

## 2013-11-03 MED ORDER — OXYCODONE-ACETAMINOPHEN 5-325 MG PO TABS: ORAL_TABLET | ORAL | Status: DC

## 2013-11-03 NOTE — Discharge Instructions (Signed)
Take percocet for breakthrough pain, do not drink alcohol, drive, care for children or do other critical tasks while taking percocet.  Please follow with your primary care doctor in the next 2 days for a check-up. They must obtain records for further management.   Do not hesitate to return to the Emergency Department for any new, worsening or concerning symptoms.    Back Pain, Adult Back pain is very common. The pain often gets better over time. The cause of back pain is usually not dangerous. Most people can learn to manage their back pain on their own.  HOME CARE   Stay active. Start with short walks on flat ground if you can. Try to walk farther each day.  Do not sit, drive, or stand in one place for more than 30 minutes. Do not stay in bed.  Do not avoid exercise or work. Activity can help your back heal faster.  Be careful when you bend or lift an object. Bend at your knees, keep the object close to you, and do not twist.  Sleep on a firm mattress. Lie on your side, and bend your knees. If you lie on your back, put a pillow under your knees.  Only take medicines as told by your doctor.  Put ice on the injured area.  Put ice in a plastic bag.  Place a towel between your skin and the bag.  Leave the ice on for 15-20 minutes, 03-04 times a day for the first 2 to 3 days. After that, you can switch between ice and heat packs.  Ask your doctor about back exercises or massage.  Avoid feeling anxious or stressed. Find good ways to deal with stress, such as exercise. GET HELP RIGHT AWAY IF:   Your pain does not go away with rest or medicine.  Your pain does not go away in 1 week.  You have new problems.  You do not feel well.  The pain spreads into your legs.  You cannot control when you poop (bowel movement) or pee (urinate).  Your arms or legs feel weak or lose feeling (numbness).  You feel sick to your stomach (nauseous) or throw up (vomit).  You have belly  (abdominal) pain.  You feel like you may pass out (faint). MAKE SURE YOU:   Understand these instructions.  Will watch your condition.  Will get help right away if you are not doing well or get worse. Document Released: 11/17/2007 Document Revised: 08/23/2011 Document Reviewed: 10/19/2010 [facility] Patient Information 2014 [place], [place].

## 2013-11-03 NOTE — ED Provider Notes (Signed)
Medical screening examination/treatment/procedure(s) were performed by non-physician practitioner and as supervising physician I was immediately available for consultation/collaboration.   EKG Interpretation None        Shanna Cisco, MD 11/03/13 [id number]

## 2013-11-03 NOTE — ED Notes (Signed)
Pt reports being seen on 5/19 for migraine and started on new medication. Reports taking the medication and then falling down a flight of steps, has been seen by pcp for the fall and back pain but is still lower back pain.

## 2013-11-03 NOTE — ED Provider Notes (Signed)
Medical screening examination/treatment/procedure(s) were performed by non-physician practitioner and as supervising physician I was immediately available for consultation/collaboration.   EKG Interpretation None       Kenndra Morris, MD 11/03/13 1336 

## 2013-11-03 NOTE — ED Provider Notes (Signed)
CSN: [id number]     Arrival date & time 11/03/13  1801 History  This chart was scribed for non-physician practitioner working with Shanna Cisco, MD by Elveria Rising, ED Scribe. This patient was seen in room TR06C/TR06C and the patient's care was started at 7:03 PM.   Chief Complaint  Patient presents with  . Fall      The history is provided by the patient. No language interpreter was used.   HPI Comments: John Duffy is a 43 y.o. male history of stoke, brain aneurysm, migraine, and HTN who presents to the Emergency Department complaining of back pain after falling down a flight of stairs x3 days ago. Patient reports being seen 5/19 for a severe migraine and given an injected medication. A few days later patient reports being awaken for dinner and attempting to descend the stairs and falling down the full flight of stairs. Patient reports no memory of the fall. His memory resumes after reaching the last step. Patient was evaluated by PCP for the fall and resulting back pain. At that time he received an X-ray: no broken bones found, but bruising noted. Patient given Flexeril and Mobic; he reports no relief with use. Denies fever, chills, change in bowel or bladder habits, h/o IDVU or cancer, numbness or weakness.  Pt denies head trauma, N/V, change in vision, cervicalgia, chest pain, SOB, abdominal pain, difficulty ambulating,  difficulty moving major joints.    Past Medical History  Diagnosis Date  . Stroke     brain aneurysm  . Hypertension   . Migraines   . Hypercholesteremia   . Asthma    Past Surgical History  Procedure Laterality Date  . Back surgery     Family History  Problem Relation Age of Onset  . Diabetes Mother   . Hypertension Mother    History  Substance Use Topics  . Smoking status: Former Smoker    Types: Cigarettes  . Smokeless tobacco: Not on file  . Alcohol Use: No    Review of Systems A complete 10 system review of systems was obtained and all systems  are negative except as noted in the HPI and PMH.     Allergies  Review of patient's allergies indicates no known allergies.  Home Medications   Prior to Admission medications   Medication Sig Start Date End Date Taking? Authorizing Provider  aspirin 81 MG chewable tablet Chew 81 mg by mouth daily with breakfast.    Historical Provider, MD  lisinopril-hydrochlorothiazide (PRINZIDE,ZESTORETIC) 20-12.5 MG per tablet Take 1 tablet by mouth daily.    Historical Provider, MD  nebivolol (BYSTOLIC) 5 MG tablet Take 5 mg by mouth daily.    Historical Provider, MD  oxyCODONE-acetaminophen (PERCOCET) 7.5-325 MG per tablet Take 1 tablet by mouth 3 (three) times daily as needed for pain.     Historical Provider, MD   Triage Vitals: BP 144/103  Pulse 74  Temp(Src) 98.1 F (36.7 C) (Oral)  Resp 18  SpO2 100% Physical Exam  Nursing note and vitals reviewed. Constitutional: He is oriented to person, place, and time. He appears well-developed and well-nourished. No distress.  HENT:  Head: Normocephalic and atraumatic.  Mouth/Throat: Oropharynx is clear and moist.  Eyes: Conjunctivae and EOM are normal. Pupils are equal, round, and reactive to light.  Neck: Normal range of motion. Neck supple. No tracheal deviation present.  No midline C-spine  tenderness to palpation or step-offs appreciated. Patient has full range of motion without pain.   Cardiovascular: Normal  rate, regular rhythm and intact distal pulses.   Pulmonary/Chest: Effort normal. No stridor. No respiratory distress.  Musculoskeletal: Normal range of motion. He exhibits no edema and no tenderness.   +right lumbar TTP & paraspinal muscular spasm.No point tenderness to percussion of lumbar spinal processes.     Strength is 5 out of 5 to bilateral lower extremities at hip and knee; extensor hallucis longus 5 out of 5. Ankle strength 5 out of 5, no clonus, neurovascularly intact. No saddle anaesthesia.     Neurological: He is alert and  oriented to person, place, and time.  Follows commands, Clear, goal oriented speech, Strength is 5 out of 5x4 extremities, patient ambulates with a coordinated in nonantalgic gait. Sensation is grossly intact.   Skin: Skin is warm and dry.  Psychiatric: He has a normal mood and affect. His behavior is normal.    ED Course  Procedures (including critical care time) DIAGNOSTIC STUDIES: Oxygen Saturation is 100% on room air, normal by my interpretation.    COORDINATION OF CARE: 6:36 PM- Discussed treatment plan with patient at bedside and patient agreed to plan.    Labs Review Labs Reviewed - No data to display  Imaging Review No results found.   EKG Interpretation None      MDM   Final diagnoses:  Lumbar strain    Filed Vitals:   11/03/13 1808 11/03/13 1852  BP: 144/103 143/107  Pulse: 74 74  Temp: 98.1 F (36.7 C) 98.3 F (36.8 C)  TempSrc: Oral Oral  Resp: 18   SpO2: 100% 100%    Medications  oxyCODONE-acetaminophen (PERCOCET/ROXICET) 5-325 MG per tablet 1 tablet (1 tablet Oral Given 11/03/13 1846)    John Duffy is a 43 y.o. male presenting with  back pain.  No neurological deficits and normal neuro exam.  Patient can walk but states is painful.  No loss of bowel or bladder control.  No concern for cauda equina.  No fever, night sweats, weight loss, h/o cancer, IVDU.  RICE protocol and pain medicine indicated and discussed with patient.  Evaluation does not show pathology that would require ongoing emergent intervention or inpatient treatment. Pt is hemodynamically stable and mentating appropriately. Discussed findings and plan with patient/guardian, who agrees with care plan. All questions answered. Return precautions discussed and outpatient follow up given.   New Prescriptions   OXYCODONE-ACETAMINOPHEN (PERCOCET/ROXICET) 5-325 MG PER TABLET    1 to 2 tabs PO q6hrs  PRN for pain    Note: Portions of this report may have been transcribed using voice  recognition software. Every effort was made to ensure accuracy; however, inadvertent computerized transcription errors may be present   I personally performed the services described in this documentation, which was scribed in my presence. The recorded information has been reviewed and is accurate.    Wynetta Emeryicole Demarius Archila, PA-C 11/03/13 1907

## 2013-12-09 ENCOUNTER — Encounter (HOSPITAL_COMMUNITY): Payer: Self-pay | Admitting: Emergency Medicine

## 2013-12-09 ENCOUNTER — Emergency Department (HOSPITAL_COMMUNITY)
Admission: EM | Admit: 2013-12-09 | Discharge: 2013-12-09 | Disposition: A | Discharge status: Home / Self Care | Payer: Medicare PPO | Attending: Emergency Medicine | Admitting: Emergency Medicine

## 2013-12-09 DIAGNOSIS — Z862 Personal history of diseases of the blood and blood-forming organs and certain disorders involving the immune mechanism: Secondary | ICD-10-CM | POA: Insufficient documentation

## 2013-12-09 DIAGNOSIS — Z87891 Personal history of nicotine dependence: Secondary | ICD-10-CM | POA: Insufficient documentation

## 2013-12-09 DIAGNOSIS — Z8673 Personal history of transient ischemic attack (TIA), and cerebral infarction without residual deficits: Secondary | ICD-10-CM | POA: Insufficient documentation

## 2013-12-09 DIAGNOSIS — M549 Dorsalgia, unspecified: Secondary | ICD-10-CM

## 2013-12-09 DIAGNOSIS — G8929 Other chronic pain: Secondary | ICD-10-CM

## 2013-12-09 DIAGNOSIS — G8921 Chronic pain due to trauma: Secondary | ICD-10-CM | POA: Insufficient documentation

## 2013-12-09 DIAGNOSIS — Z79899 Other long term (current) drug therapy: Secondary | ICD-10-CM | POA: Insufficient documentation

## 2013-12-09 DIAGNOSIS — I10 Essential (primary) hypertension: Secondary | ICD-10-CM | POA: Insufficient documentation

## 2013-12-09 DIAGNOSIS — M545 Low back pain, unspecified: Secondary | ICD-10-CM | POA: Insufficient documentation

## 2013-12-09 DIAGNOSIS — Z8639 Personal history of other endocrine, nutritional and metabolic disease: Secondary | ICD-10-CM | POA: Insufficient documentation

## 2013-12-09 DIAGNOSIS — G43909 Migraine, unspecified, not intractable, without status migrainosus: Secondary | ICD-10-CM | POA: Insufficient documentation

## 2013-12-09 DIAGNOSIS — J45909 Unspecified asthma, uncomplicated: Secondary | ICD-10-CM | POA: Insufficient documentation

## 2013-12-09 DIAGNOSIS — Z7982 Long term (current) use of aspirin: Secondary | ICD-10-CM | POA: Insufficient documentation

## 2013-12-09 MED ORDER — METHOCARBAMOL 500 MG PO TABS: 1000.0000 mg | ORAL_TABLET | Freq: Four times a day (QID) | ORAL | Status: DC

## 2013-12-09 MED ORDER — OXYCODONE-ACETAMINOPHEN 5-325 MG PO TABS: 1.0000 | ORAL_TABLET | Freq: Four times a day (QID) | ORAL | Status: DC | PRN

## 2013-12-09 NOTE — ED Notes (Signed)
Reports hx of back pain, had a fall one month ago and has led to severe lower back pain, radiates down bilateral legs. Ambulatory at triage.

## 2013-12-09 NOTE — ED Notes (Signed)
Patient declines wheelchair at discharge.  Patient escorted to lobby by RN.  

## 2013-12-09 NOTE — Discharge Instructions (Signed)
Please read and follow all provided instructions.  Your diagnoses today include:  1. Chronic back pain     Tests performed today include:  Vital signs - see below for your results today  Medications prescribed:   Percocet (oxycodone/acetaminophen) - narcotic pain medication  DO NOT drive or perform any activities that require you to be awake and alert because this medicine can make you drowsy. BE VERY CAREFUL not to take multiple medicines containing Tylenol (also called acetaminophen). Doing so can lead to an overdose which can damage your liver and cause liver failure and possibly death.   Robaxin (methocarbamol) - muscle relaxer medication  DO NOT drive or perform any activities that require you to be awake and alert because this medicine can make you drowsy.   Take any prescribed medications only as directed.  Home care instructions:   Follow any educational materials contained in this packet  Please rest, use ice or heat on your back for the next several days  Do not lift, push, pull anything more than 10 pounds for the next week  Follow-up instructions: Please follow-up with your primary care provider in the next 1 week for further evaluation of your symptoms.   Return instructions:  SEEK IMMEDIATE MEDICAL ATTENTION IF YOU HAVE:  New numbness, tingling, weakness, or problem with the use of your arms or legs  Severe back pain not relieved with medications  Loss control of your bowels or bladder  Increasing pain in any areas of the body (such as chest or abdominal pain)  Shortness of breath, dizziness, or fainting.   Worsening nausea (feeling sick to your stomach), vomiting, fever, or sweats  Any other emergent concerns regarding your health   Additional Information:  Your vital signs today were: BP 167/90   Pulse 88   Temp(Src) 98.2 F (36.8 C) (Oral)   Resp 18   Ht 5\' 9"  (1.753 m)   Wt 173 lb 14.4 oz (78.881 kg)   BMI 25.67 kg/m2   SpO2 98% If your blood  pressure (BP) was elevated above 135/85 this visit, please have this repeated by your doctor within one month. --------------

## 2013-12-09 NOTE — ED Provider Notes (Signed)
CSN: [id number]     Arrival date & time 12/09/13  1844 History  This chart was scribed for non-physician practitioner, Renne CriglerJoshua Geiple, PA-C, working with Shon Batonourtney F Horton, MD by Shari HeritageAisha Amuda, ED Scribe. This patient was seen in room TR08C/TR08C and the patient's care was started at 7:39 PM.   Chief Complaint  Patient presents with  . Back Pain    The history is provided by the patient. No language interpreter was used.    HPI Comments: John Duffy is a 43 y.o. Male with a 12-year history of back pain and 2 back surgeries who presents to the Emergency Department complaining of constant, severe bilateral lower back pain that radiates down his buttocks and posterior thighs. Patient states that he had a fall 1 month ago that has exacerbated his regular pain. Patient is being followed by a physician in [facility] who has prescribed Percocet 10-325 mg, but patient says that he ran out 2 days ago - he states that he is supposed to take 3-4 tablets per day, but he taken up to 5 tablets on some days. He denies bowel incontinence, bladder incontinence, saddle anesthesia, difficulty urinating, fever, chills, nausea, vomiting, numbness or weakness of the extremities. He denies any recent unexplained weight loss. He has not had any recent injections. Patient has a medical history of CVA with brain aneurysm, hypertension, and asthma. He was taken off anticoagulants last month. He continues on antihypertensive medications.   Past Medical History  Diagnosis Date  . Stroke     brain aneurysm  . Hypertension   . Migraines   . Hypercholesteremia   . Asthma    Past Surgical History  Procedure Laterality Date  . Back surgery     Family History  Problem Relation Age of Onset  . Diabetes Mother   . Hypertension Mother    History  Substance Use Topics  . Smoking status: Former Smoker    Types: Cigarettes  . Smokeless tobacco: Not on file  . Alcohol Use: No    Review of Systems  Constitutional: Negative  for fever, chills and unexpected weight change.  Gastrointestinal: Negative for nausea, vomiting and constipation.       Negative for bowel incontinence.  Genitourinary: Negative for hematuria, flank pain and difficulty urinating.       Negative for bladder incontinence.  Musculoskeletal: Positive for back pain and myalgias (posterior leg pain).  Neurological: Negative for weakness and numbness.       Negative for saddle paresthesias     Allergies  Review of patient's allergies indicates no known allergies.  Home Medications   Prior to Admission medications   Medication Sig Start Date End Date Taking? Authorizing Provider  aspirin 81 MG chewable tablet Chew 81 mg by mouth daily with breakfast.    Historical Provider, MD  lisinopril-hydrochlorothiazide (PRINZIDE,ZESTORETIC) 20-12.5 MG per tablet Take 1 tablet by mouth daily.    Historical Provider, MD  methocarbamol (ROBAXIN) 500 MG tablet Take 2 tablets (1,000 mg total) by mouth 4 (four) times daily. 12/09/13   Renne CriglerJoshua Geiple, PA-C  nebivolol (BYSTOLIC) 5 MG tablet Take 5 mg by mouth daily.    Historical Provider, MD  oxyCODONE-acetaminophen (PERCOCET/ROXICET) 5-325 MG per tablet Take 1-2 tablets by mouth every 6 (six) hours as needed for severe pain. 12/09/13   Renne CriglerJoshua Geiple, PA-C   Triage Vitals: BP 167/90  Pulse 88  Temp(Src) 98.2 F (36.8 C) (Oral)  Resp 18  Ht 5\' 9"  (1.753 m)  Wt 173 lb  14.4 oz (78.881 kg)  BMI 25.67 kg/m2  SpO2 98%  Physical Exam  Nursing note and vitals reviewed. Constitutional: He appears well-developed and well-nourished. No distress.  HENT:  Head: Normocephalic and atraumatic.  Eyes: Conjunctivae and EOM are normal.  Neck: Normal range of motion. Neck supple. No tracheal deviation present.  Cardiovascular: Normal rate.   Pulmonary/Chest: Effort normal. No respiratory distress.  Abdominal: Soft. There is no tenderness. There is no CVA tenderness.  Musculoskeletal: Normal range of motion.        Cervical back: He exhibits normal range of motion, no tenderness and no bony tenderness.       Thoracic back: He exhibits normal range of motion, no tenderness and no bony tenderness.       Lumbar back: He exhibits tenderness. He exhibits normal range of motion and no bony tenderness.       Back:  No step-off noted with palpation of spine.   Neurological: He is alert. He has normal reflexes. No sensory deficit. He exhibits normal muscle tone.  5/5 strength in entire lower extremities bilaterally. No sensation deficit.   Skin: Skin is warm and dry.  Psychiatric: He has a normal mood and affect. His behavior is normal.    ED Course  Procedures (including critical care time) DIAGNOSTIC STUDIES: Oxygen Saturation is 98% on room air, normal by my interpretation.    COORDINATION OF CARE: 7:46 PM- Patient informed of current plan for treatment and evaluation and agrees with plan at this time.   Patient Vitals for the past 24 hrs:  BP Temp Temp src Pulse Resp SpO2 Height Weight  12/09/13 1859 167/90 mmHg 98.2 F (36.8 C) Oral 88 18 98 % 5\' 9"  (1.753 m) 173 lb 14.4 oz (78.881 kg)   Patient was forthcoming regarding his pain medication use, including overuse. He is to f/u with PCP/pain management for long term treatment.   No red flag s/s of low back pain. Patient was counseled on back pain precautions and told to do activity as tolerated but do not lift, push, or pull heavy objects more than 10 pounds for the next week.  Patient counseled to use ice or heat on back for no longer than 15 minutes every hour.   Patient prescribed muscle relaxer and counseled on proper use of muscle relaxant medication.    Patient prescribed narcotic pain medicine and counseled on proper use of narcotic pain medications. Counseled not to combine this medication with others containing tylenol.   Urged patient not to drink alcohol, drive, or perform any other activities that requires focus while taking either of  these medications.  Patient urged to follow-up with PCP if pain does not improve with treatment and rest or if pain becomes recurrent. Urged to return with worsening severe pain, loss of bowel or bladder control, trouble walking.   The patient verbalizes understanding and agrees with the plan.  MDM   Final diagnoses:  Chronic back pain   Patient with chronic back pain with radicular features. No neurological deficits. Patient is ambulatory. No warning symptoms of back pain including: loss of bowel or bladder control, night sweats, waking from sleep with back pain, unexplained fevers or weight loss, h/o cancer, IVDU, recent trauma. No concern for cauda equina, epidural abscess, or other serious cause of back pain. Conservative measures such as rest, ice/heat and pain medicine indicated with PCP follow-up if no improvement with conservative management.   I personally performed the services described in this documentation, which was  scribed in my presence. The recorded information has been reviewed and is accurate.    Renne CriglerJoshua Geiple, PA-C 12/09/13 2104

## 2013-12-10 NOTE — ED Provider Notes (Signed)
Medical screening examination/treatment/procedure(s) were performed by non-physician practitioner and as supervising physician I was immediately available for consultation/collaboration.   EKG Interpretation None       Courtney F Horton, MD 12/10/13 1927 

## 2013-12-22 ENCOUNTER — Encounter (HOSPITAL_COMMUNITY): Payer: Self-pay | Admitting: Emergency Medicine

## 2013-12-22 ENCOUNTER — Emergency Department (HOSPITAL_COMMUNITY)
Admission: EM | Admit: 2013-12-22 | Discharge: 2013-12-22 | Disposition: A | Discharge status: Home / Self Care | Payer: Medicare PPO | Attending: Emergency Medicine | Admitting: Emergency Medicine

## 2013-12-22 DIAGNOSIS — M549 Dorsalgia, unspecified: Secondary | ICD-10-CM

## 2013-12-22 DIAGNOSIS — Z8639 Personal history of other endocrine, nutritional and metabolic disease: Secondary | ICD-10-CM | POA: Insufficient documentation

## 2013-12-22 DIAGNOSIS — Z9889 Other specified postprocedural states: Secondary | ICD-10-CM | POA: Insufficient documentation

## 2013-12-22 DIAGNOSIS — J45909 Unspecified asthma, uncomplicated: Secondary | ICD-10-CM | POA: Insufficient documentation

## 2013-12-22 DIAGNOSIS — G8929 Other chronic pain: Secondary | ICD-10-CM | POA: Insufficient documentation

## 2013-12-22 DIAGNOSIS — M545 Low back pain, unspecified: Secondary | ICD-10-CM | POA: Insufficient documentation

## 2013-12-22 DIAGNOSIS — I10 Essential (primary) hypertension: Secondary | ICD-10-CM | POA: Insufficient documentation

## 2013-12-22 DIAGNOSIS — Z8701 Personal history of pneumonia (recurrent): Secondary | ICD-10-CM | POA: Insufficient documentation

## 2013-12-22 DIAGNOSIS — Z862 Personal history of diseases of the blood and blood-forming organs and certain disorders involving the immune mechanism: Secondary | ICD-10-CM | POA: Insufficient documentation

## 2013-12-22 DIAGNOSIS — Z87891 Personal history of nicotine dependence: Secondary | ICD-10-CM | POA: Insufficient documentation

## 2013-12-22 DIAGNOSIS — Z79899 Other long term (current) drug therapy: Secondary | ICD-10-CM | POA: Insufficient documentation

## 2013-12-22 MED ORDER — OXYCODONE-ACETAMINOPHEN 5-325 MG PO TABS: 1.0000 | ORAL_TABLET | ORAL | Status: DC | PRN

## 2013-12-22 NOTE — ED Provider Notes (Signed)
Medical screening examination/treatment/procedure(s) were performed by non-physician practitioner and as supervising physician I was immediately available for consultation/collaboration.   EKG Interpretation None        Leota Maka, MD 12/22/13 2311 

## 2013-12-22 NOTE — Discharge Instructions (Signed)
Read the information below.  Use the prescribed medication as directed.  Please discuss all new medications with your pharmacist.  Do not take additional tylenol while taking the prescribed pain medication to avoid overdose.  You may return to the Emergency Department at any time for worsening condition or any new symptoms that concern you.   If you develop fevers, loss of control of bowel or bladder, weakness or numbness in your legs, or are unable to walk, return to the ER for a recheck.  ° °Chronic Back Pain ° When back pain lasts longer than 3 months, it is called chronic back pain. People with chronic back pain often go through certain periods that are more intense (flare-ups).  °CAUSES °Chronic back pain can be caused by wear and tear (degeneration) on different structures in your back. These structures include: °· The bones of your spine (vertebrae) and the joints surrounding your spinal cord and nerve roots (facets). °· The strong, fibrous tissues that connect your vertebrae (ligaments). °Degeneration of these structures may result in pressure on your nerves. This can lead to constant pain. °HOME CARE INSTRUCTIONS °· Avoid bending, heavy lifting, prolonged sitting, and activities which make the problem worse. °· Take brief periods of rest throughout the day to reduce your pain. Lying down or standing usually is better than sitting while you are resting. °· Take over-the-counter or prescription medicines only as directed by your caregiver. °SEEK IMMEDIATE MEDICAL CARE IF:  °· You have weakness or numbness in one of your legs or feet. °· You have trouble controlling your bladder or bowels. °· You have nausea, vomiting, abdominal pain, shortness of breath, or fainting. °Document Released: 07/08/2004 Document Revised: 08/23/2011 Document Reviewed: 05/15/2011 °[employer]® Patient Information ©2015 [employer]. This information is not intended to replace advice given to you by your health care provider. Make sure  you discuss any questions you have with your health care provider. ° °

## 2013-12-22 NOTE — ED Provider Notes (Signed)
CSN: [id number]     Arrival date & time 12/22/13  1809 History  This chart was scribed for non-physician practitioner working with Glynn OctaveStephen Rancour, MD by Elveria Risingimelie Horne, ED Scribe. This patient was seen in room TR10C/TR10C and the patient's care was started at 7:19 PM.   Chief Complaint  Patient presents with  . Back Pain      The history is provided by the patient. No language interpreter was used.   HPI Comments: John HarrowJames Duffy is a 43 y.o. male who presents to the Emergency Department complaining of chronic back pain; result of on the job injury. Patient reports a misprint on his insurance card and he is unable to receive treatment at his PCP or the pain clinic. Patient states that he now has to visit the ED for treatment. Patient states that he has exhausted his Percocet and has been without medication for three days. Patient states that his pain has worsened because he been without medication and denies new injury or trauma. Patient reports throbbing pain in his lower back with radiation to the right leg alone. Patient reports intermittent numbness in his right leg with weakness. Patient reports difficulty ambulating and use of cane.  Patient reports history of two back surgeries and he is schedule for a third in August. [place]. Patient under contract with pain clinic in [place] - denies any limitation to prescription from hospital on this pain contract.   Patient denies fever, chills, dysuria, bowel or bladder incontinence, saddle anesthesia, or abdominal pain.  Patient denies history of cancer or IV drug use.   Past Medical History  Diagnosis Date  . Stroke     brain aneurysm  . Hypertension   . Migraines   . Hypercholesteremia   . Asthma    Past Surgical History  Procedure Laterality Date  . Back surgery     Family History  Problem Relation Age of Onset  . Diabetes Mother   . Hypertension Mother    History  Substance Use Topics  . Smoking status: Former  Smoker    Types: Cigarettes  . Smokeless tobacco: Not on file  . Alcohol Use: No    Review of Systems  A complete 10 system review of systems was obtained and all systems are negative except as noted in the HPI and PMH.    Allergies  Review of patient's allergies indicates no known allergies.  Home Medications   Prior to Admission medications   Medication Sig Start Date End Date Taking? Authorizing Provider  lisinopril-hydrochlorothiazide (PRINZIDE,ZESTORETIC) 20-12.5 MG per tablet Take 1 tablet by mouth daily.   Yes Historical Provider, MD  nebivolol (BYSTOLIC) 5 MG tablet Take 5 mg by mouth daily.   Yes Historical Provider, MD  Triage Vitals: BP 142/94  Pulse 75  Temp(Src) 98.1 F (36.7 C) (Oral)  Resp 18  Ht 5\' 9"  (1.753 m)  Wt 169 lb (76.658 kg)  BMI 24.95 kg/m2  SpO2 97% Physical Exam  Nursing note and vitals reviewed. Constitutional: He appears well-developed and well-nourished. No distress.  HENT:  Head: Normocephalic and atraumatic.  Neck: Neck supple.  Pulmonary/Chest: Effort normal.  Abdominal: Soft. He exhibits no distension. There is no tenderness. There is no rebound and no guarding.  Musculoskeletal:  Diffuse tenderness over lower spine. No skin changes. Right lower back tender.  5/5 strength in left lower extremities.  4/5 strength in right extremity (chronic and unchanged from baseline per patient) Sensation intact. Intact distal pulses.   Neurological:  He is alert.  Able to ambulate  Skin: He is not diaphoretic.    ED Course  Procedures (including critical care time) COORDINATION OF CARE: 7:30 PM- Discussed treatment plan with patient at bedside and patient agreed to plan.   Labs Review Labs Reviewed - No data to display  Imaging Review No results found.   EKG Interpretation None      MDM   Final diagnoses:  Chronic back pain    Afebrile, nontoxic patient with chronic low back pain without any change in baseline, out of medications  secondary to insurance issue. No red flags for back pain.  D/C home with #15 percocet, PCP/pain management/back surgeon follow up.   Discussed result, findings, treatment, and follow up  with patient.  Pt given return precautions.  Pt verbalizes understanding and agrees with plan.      I personally performed the services described in this documentation, which was scribed in my presence. The recorded information has been reviewed and is accurate.    Trixie Dredge, PA-C 12/22/13 2049

## 2013-12-22 NOTE — ED Notes (Signed)
The pt has had back pain since 1998 when he had back surgeries

## 2013-12-26 ENCOUNTER — Encounter (HOSPITAL_COMMUNITY): Payer: Self-pay | Admitting: Emergency Medicine

## 2013-12-26 ENCOUNTER — Emergency Department (HOSPITAL_COMMUNITY)
Admission: EM | Admit: 2013-12-26 | Discharge: 2013-12-26 | Disposition: A | Discharge status: Home / Self Care | Payer: Medicare PPO | Attending: Emergency Medicine | Admitting: Emergency Medicine

## 2013-12-26 DIAGNOSIS — M545 Low back pain, unspecified: Secondary | ICD-10-CM | POA: Diagnosis present

## 2013-12-26 DIAGNOSIS — Z87891 Personal history of nicotine dependence: Secondary | ICD-10-CM | POA: Diagnosis not present

## 2013-12-26 DIAGNOSIS — Z79899 Other long term (current) drug therapy: Secondary | ICD-10-CM | POA: Diagnosis not present

## 2013-12-26 DIAGNOSIS — J45909 Unspecified asthma, uncomplicated: Secondary | ICD-10-CM | POA: Diagnosis not present

## 2013-12-26 DIAGNOSIS — Z9889 Other specified postprocedural states: Secondary | ICD-10-CM | POA: Insufficient documentation

## 2013-12-26 DIAGNOSIS — G8929 Other chronic pain: Secondary | ICD-10-CM | POA: Diagnosis not present

## 2013-12-26 DIAGNOSIS — Z862 Personal history of diseases of the blood and blood-forming organs and certain disorders involving the immune mechanism: Secondary | ICD-10-CM | POA: Diagnosis not present

## 2013-12-26 DIAGNOSIS — Z8639 Personal history of other endocrine, nutritional and metabolic disease: Secondary | ICD-10-CM | POA: Insufficient documentation

## 2013-12-26 DIAGNOSIS — I10 Essential (primary) hypertension: Secondary | ICD-10-CM | POA: Insufficient documentation

## 2013-12-26 DIAGNOSIS — Z8673 Personal history of transient ischemic attack (TIA), and cerebral infarction without residual deficits: Secondary | ICD-10-CM | POA: Insufficient documentation

## 2013-12-26 HISTORY — DX: Dorsalgia, unspecified: M54.9

## 2013-12-26 HISTORY — DX: Other chronic pain: G89.29

## 2013-12-26 MED ORDER — METHOCARBAMOL 500 MG PO TABS: 500.0000 mg | ORAL_TABLET | Freq: Three times a day (TID) | ORAL | Status: DC | PRN

## 2013-12-26 MED ORDER — NAPROXEN 500 MG PO TABS: 500.0000 mg | ORAL_TABLET | Freq: Two times a day (BID) | ORAL | Status: DC | PRN

## 2013-12-26 MED ORDER — PREDNISONE 20 MG PO TABS: ORAL_TABLET | ORAL | Status: DC

## 2013-12-26 NOTE — ED Notes (Signed)
Pt. reports chronic back pain worse last month after a fall , ambulatory / denies urinary discomfort , history of back surgery - pt. stated he is scheduled for MRI this month .

## 2013-12-26 NOTE — ED Provider Notes (Signed)
CSN: [id number]     Arrival date & time 12/26/13  2013 History  This chart was scribed for John StraussMercedes Camprubi-Soms, PA-C working with Laray AngerKathleen M McManus, DO by Evon Slackerrance Branch, ED Scribe. This patient was seen in room TR07C/TR07C and the patient's care was started at 9:27 PM.      Chief Complaint  Patient presents with  . Back Pain   HPI Comments: John Duffy is a 43 y.o. male with a PMHx of chronic back pain, HTN, and HLD, who presents to the Emergency Department complaining of constant chronic low back pain onset 1998. He present with a history of prior back surgery also. He states he recently fell last month when his pain worsened, but was evaluated by the ED at that time. He states that the pain radiates down into his legs posteriorly, which is chronic as well. Endorses paresthesias into b/l legs, which is chronic. He states he has tried cold and warm compress with no relief. He states that he was recently in the ED for similar symptoms and received percocet that provided relief. He denies weakness, urine/bowel incontinence, headache, fevers, chills, SOB, and chest pain. Denies urinary symptoms, denies cauda equina symptoms. Denies IVD use or weight loss. States this pain is chronic and unchanged. Was seeing pain management clinic in the past but has not been there recently. Has appt with orthopedic dr next month for MRI, but had issues with insurance.  Patient is a 43 y.o. male presenting with back pain. The history is provided by the patient. No language interpreter was used.  Back Pain Location:  Lumbar spine Quality:  Aching Radiates to:  L posterior upper leg and R posterior upper leg Pain severity:  Moderate Pain is:  Same all the time Onset quality:  Gradual Timing:  Intermittent Progression:  Unchanged Chronicity:  Chronic Context comment:  Old injury and back surgeries in 1998 Relieved by:  Nothing Worsened by:  Movement Ineffective treatments:  Narcotics, NSAIDs and OTC  medications Associated symptoms: leg pain and tingling   Associated symptoms: no abdominal pain, no bladder incontinence, no bowel incontinence, no chest pain, no dysuria, no fever, no headaches, no numbness, no paresthesias, no perianal numbness, no weakness and no weight loss      Past Medical History  Diagnosis Date  . Stroke     brain aneurysm  . Hypertension   . Migraines   . Hypercholesteremia   . Asthma   . Chronic back pain    Past Surgical History  Procedure Laterality Date  . Back surgery     Family History  Problem Relation Age of Onset  . Diabetes Mother   . Hypertension Mother    History  Substance Use Topics  . Smoking status: Former Smoker    Types: Cigarettes  . Smokeless tobacco: Not on file  . Alcohol Use: No    Review of Systems  Constitutional: Negative for fever, chills and weight loss.  Eyes: Negative for visual disturbance.  Respiratory: Negative for chest tightness and shortness of breath.   Cardiovascular: Negative for chest pain.  Gastrointestinal: Negative for nausea, vomiting, abdominal pain, diarrhea, constipation and bowel incontinence.  Genitourinary: Negative for bladder incontinence, dysuria and urgency.  Musculoskeletal: Positive for back pain. Negative for myalgias, neck pain and neck stiffness.  Skin: Negative for color change.  Neurological: Positive for tingling. Negative for dizziness, weakness, numbness, headaches and paresthesias.  Psychiatric/Behavioral: Negative for confusion.  10 Systems reviewed and are negative for acute change except as noted  in the HPI.  Allergies  Review of patient's allergies indicates no known allergies.  Home Medications   Prior to Admission medications   Medication Sig Start Date End Date Taking? Authorizing Provider  aspirin 81 MG tablet Take 81 mg by mouth daily.   Yes Historical Provider, MD  lisinopril-hydrochlorothiazide (PRINZIDE,ZESTORETIC) 20-12.5 MG per tablet Take 1 tablet by mouth  daily.   Yes Historical Provider, MD  nebivolol (BYSTOLIC) 5 MG tablet Take 5 mg by mouth daily.   Yes Historical Provider, MD  oxyCODONE-acetaminophen (PERCOCET/ROXICET) 5-325 MG per tablet Take 1 tablet by mouth every 4 (four) hours as needed for moderate pain or severe pain. 12/22/13  Yes Trixie Dredge, PA-C  methocarbamol (ROBAXIN) 500 MG tablet Take 1 tablet (500 mg total) by mouth every 8 (eight) hours as needed for muscle spasms. 12/26/13   Arieon Corcoran Strupp Camprubi-Soms, PA-C  naproxen (NAPROSYN) 500 MG tablet Take 1 tablet (500 mg total) by mouth 2 (two) times daily as needed for mild pain, moderate pain or headache (TAKE WITH MEALS.). 12/26/13   Candelaria Pies Strupp Camprubi-Soms, PA-C  predniSONE (DELTASONE) 20 MG tablet 3 tabs po daily x 4 days 12/26/13   [place] Camprubi-Soms, PA-C   Triage Vitals: BP 140/106  Pulse 81  Temp(Src) 98.4 F (36.9 C) (Oral)  Resp 16  Ht 5\' 11"  (1.803 m)  Wt 163 lb (73.936 kg)  BMI 22.74 kg/m2  SpO2 100%  Physical Exam  Nursing note and vitals reviewed. Constitutional: He is oriented to person, place, and time. He appears well-developed and well-nourished. No distress.  Hypertensive but chronic per pt. NAD  HENT:  Head: Normocephalic and atraumatic.  Mouth/Throat: Mucous membranes are normal.  Eyes: Conjunctivae and EOM are normal. Pupils are equal, round, and reactive to light.  Neck: Normal range of motion. Neck supple. No spinous process tenderness and no muscular tenderness present. No rigidity. No tracheal deviation and normal range of motion present.  FROM intact, no spinous process or paracervical muscle TTP  Cardiovascular: Normal rate and intact distal pulses.   Pulmonary/Chest: Effort normal. No respiratory distress.  Abdominal: Normal appearance. He exhibits no distension.  Musculoskeletal: Normal range of motion.       Cervical back: Normal.       Thoracic back: Normal.       Lumbar back: He exhibits pain and spasm. He exhibits normal  range of motion, no bony tenderness and no deformity.  Lumbar paraspinous muscles TTP bilaterally with mild spasm. No midline TTP or deformity, midline scar noted and well healed. +SLR in R leg. FROM intact, although painful. B/L legs non-TTP at hips, knees, and ankles. Strength 4/5 in RLE, which is chronic and at baseline per pt. Strength in LLE 5/5. Distal pulses intact. Mildly antalgic gait. Sensation grossly intact.  Neurological: He is alert and oriented to person, place, and time. No sensory deficit.  Sensation grossly intact in all extremities. Strength 4/5 in RLE, 5/5 in LLE, chronic and at baseline per pt. Strength 5/5 in BUEs.   Skin: Skin is warm, dry and intact.     Midline well healed scar over lumbar spine.  Psychiatric: He has a normal mood and affect. His behavior is normal.    ED Course  Procedures (including critical care time) DIAGNOSTIC STUDIES: Oxygen Saturation is 100% on RA, normal by my interpretation.    COORDINATION OF CARE: 9:42 PM-Discussed treatment plan which includes muscle relaxer's, anti inflammatory, and pain medication with pt at bedside and pt agreed to plan.  Labs Review Labs Reviewed - No data to display  Imaging Review No results found.   EKG Interpretation None      MDM   Final diagnoses:  Chronic lower back pain   Cove Haydon is a 43 y.o. male with a PMHx of chronic back pain, HTN, and HLD, presenting with ongoing chronic back pain. Has appt with ortho this month for MRI. No longer seeing pain clinic. Has been seen in ED multiple times, most recently on 7/11 when he was given 15 percocet. Review of drug database reveals excessive amounts of oxycodone scripts being filled, many very close together and indicates pt is taking more pain meds than directed. Exam reveals baseline RLE weakness, pt states he had a CVA years ago and has this as a residual issue but is unchanged. Also reveals tight paraspinous lumbar muscles, no midline or bony  TTP. No need for imaging at this time due to the fact that there are no red flag s/sx, no cord compression or cauda equina symptoms. I believe pt needs outpatient pain management and MRI by ortho dr. Stann Mainland rx prednisone to help with inflammation and radicular symptoms, as well as robaxin and naprosyn. Will not rx narcotics given massive abuse noted on database query. Will refer to ortho for further management.  I personally performed the services described in this documentation, which was scribed in my presence. The recorded information has been reviewed and is accurate.  BP 139/99  Pulse 66  Temp(Src) 98.2 F (36.8 C) (Oral)  Resp 15  Ht 5\' 11"  (1.803 m)  Wt 163 lb (73.936 kg)  BMI 22.74 kg/m2  SpO2 100%   [employer], PA-C 12/26/13 2213

## 2013-12-26 NOTE — Discharge Instructions (Signed)
Back Pain:  Your back pain should be treated with medicines such as ibuprofen or aleve and this back pain should get better over the next 2 weeks.  However if you develop severe or worsening pain, low back pain with fever, numbness, weakness or inability to walk or urinate, you should return to the ER immediately.  Please follow up with your doctor this week for a recheck if still having symptoms.  Low back pain is discomfort in the lower back that may be due to injuries to muscles and ligaments around the spine.  Occasionally, it may be caused by a a problem to a part of the spine called a disc.  The pain may last several days or a week;  However, most patients get completely well in 4 weeks.  Self - care:  The application of heat can help soothe the pain.  Maintaining your daily activities, including walking, is encourged, as it will help you get better faster than just staying in bed. Perform gentle stretching as discussed. Drink plenty of fluids. a Medications are also useful to help with pain control.  A commonly prescribed medications includes acetaminophen.  This medication is generally safe, though you should not take more than 6 of the extra strength (500mg ) pills a day.  Non steroidal anti inflammatory medications including Ibuprofen and naproxen;  These medications help both pain and swelling and are very useful in treating back pain.  They should be taken with food, as they can cause stomach upset, and more seriously, stomach bleeding.    Steroidal anti inflammatory (prednisone): take as directed for pain and swelling control, and take at breakfast time.  Muscle relaxants (robaxin):  These medications can help with muscle tightness that is a cause of lower back pain.  Most of these medications can cause drowsiness, and it is not safe to drive or use dangerous machinery while taking them.  SEEK IMMEDIATE MEDICAL ATTENTION IF: New numbness, tingling, weakness, or problem with the use of  your arms or legs.  Severe back pain not relieved with medications.  Difficulty with or loss of control of your bowel or bladder control.  Increasing pain in any areas of the body (such as chest or abdominal pain).  Shortness of breath, dizziness or fainting.  Nausea (feeling sick to your stomach), vomiting, fever, or sweats.  You will need to follow up with  Your primary healthcare provider in 1-2 weeks for reassessment.  If you do not have a doctor see the list below.   Chronic Back Pain  When back pain lasts longer than 3 months, it is called chronic back pain.People with chronic back pain often go through certain periods that are more intense (flare-ups).  CAUSES Chronic back pain can be caused by wear and tear (degeneration) on different structures in your back. These structures include:  The bones of your spine (vertebrae) and the joints surrounding your spinal cord and nerve roots (facets).  The strong, fibrous tissues that connect your vertebrae (ligaments). Degeneration of these structures may result in pressure on your nerves. This can lead to constant pain. HOME CARE INSTRUCTIONS  Avoid bending, heavy lifting, prolonged sitting, and activities which make the problem worse.  Take brief periods of rest throughout the day to reduce your pain. Lying down or standing usually is better than sitting while you are resting.  Take over-the-counter or prescription medicines only as directed by your caregiver. SEEK IMMEDIATE MEDICAL CARE IF:   You have weakness or numbness in one  of your legs or feet.  You have trouble controlling your bladder or bowels.  You have nausea, vomiting, abdominal pain, shortness of breath, or fainting. Document Released: 07/08/2004 Document Revised: 08/23/2011 Document Reviewed: 05/15/2011 [facility] Patient Information 2015 [place], [place]. This information is not intended to replace advice given to you by your health care provider. Make sure you  discuss any questions you have with your health care provider.

## 2013-12-27 NOTE — ED Provider Notes (Signed)
Medical screening examination/treatment/procedure(s) were performed by non-physician practitioner and as supervising physician I was immediately available for consultation/collaboration.   EKG Interpretation None        Myrl Bynum M Damieon Armendariz, DO 12/27/13 1405 

## 2013-12-30 ENCOUNTER — Encounter (HOSPITAL_COMMUNITY): Payer: Self-pay | Admitting: Emergency Medicine

## 2013-12-30 ENCOUNTER — Emergency Department (HOSPITAL_COMMUNITY)
Admission: EM | Admit: 2013-12-30 | Discharge: 2013-12-30 | Disposition: A | Discharge status: Home / Self Care | Payer: Medicare PPO | Attending: Emergency Medicine | Admitting: Emergency Medicine

## 2013-12-30 DIAGNOSIS — M545 Low back pain, unspecified: Secondary | ICD-10-CM | POA: Insufficient documentation

## 2013-12-30 DIAGNOSIS — M129 Arthropathy, unspecified: Secondary | ICD-10-CM | POA: Insufficient documentation

## 2013-12-30 DIAGNOSIS — Z9889 Other specified postprocedural states: Secondary | ICD-10-CM | POA: Insufficient documentation

## 2013-12-30 DIAGNOSIS — Z79899 Other long term (current) drug therapy: Secondary | ICD-10-CM | POA: Insufficient documentation

## 2013-12-30 DIAGNOSIS — Z8639 Personal history of other endocrine, nutritional and metabolic disease: Secondary | ICD-10-CM | POA: Insufficient documentation

## 2013-12-30 DIAGNOSIS — I10 Essential (primary) hypertension: Secondary | ICD-10-CM | POA: Insufficient documentation

## 2013-12-30 DIAGNOSIS — J45909 Unspecified asthma, uncomplicated: Secondary | ICD-10-CM | POA: Insufficient documentation

## 2013-12-30 DIAGNOSIS — IMO0002 Reserved for concepts with insufficient information to code with codable children: Secondary | ICD-10-CM | POA: Insufficient documentation

## 2013-12-30 DIAGNOSIS — M549 Dorsalgia, unspecified: Secondary | ICD-10-CM

## 2013-12-30 DIAGNOSIS — Z8673 Personal history of transient ischemic attack (TIA), and cerebral infarction without residual deficits: Secondary | ICD-10-CM | POA: Insufficient documentation

## 2013-12-30 DIAGNOSIS — Z87891 Personal history of nicotine dependence: Secondary | ICD-10-CM | POA: Insufficient documentation

## 2013-12-30 DIAGNOSIS — Z862 Personal history of diseases of the blood and blood-forming organs and certain disorders involving the immune mechanism: Secondary | ICD-10-CM | POA: Insufficient documentation

## 2013-12-30 DIAGNOSIS — G8929 Other chronic pain: Secondary | ICD-10-CM | POA: Insufficient documentation

## 2013-12-30 DIAGNOSIS — Z7982 Long term (current) use of aspirin: Secondary | ICD-10-CM | POA: Insufficient documentation

## 2013-12-30 HISTORY — DX: Aneurysm of unspecified site: I72.9

## 2013-12-30 MED ORDER — OXYCODONE-ACETAMINOPHEN 5-325 MG PO TABS
2.0000 | ORAL_TABLET | Freq: Once | ORAL | Status: AC
  Administered 2013-12-30: 2 via ORAL
  Filled 2013-12-30: qty 2

## 2013-12-30 MED ORDER — BACLOFEN 20 MG PO TABS: 20.0000 mg | ORAL_TABLET | Freq: Three times a day (TID) | ORAL | Status: DC

## 2013-12-30 MED ORDER — OXYCODONE-ACETAMINOPHEN 5-325 MG PO TABS: 1.0000 | ORAL_TABLET | ORAL | Status: DC | PRN

## 2013-12-30 MED ORDER — DOCUSATE SODIUM 100 MG PO CAPS: 100.0000 mg | ORAL_CAPSULE | Freq: Two times a day (BID) | ORAL | Status: DC

## 2013-12-30 MED ORDER — HYDROMORPHONE HCL PF 1 MG/ML IJ SOLN
2.0000 mg | Freq: Once | INTRAMUSCULAR | Status: AC
  Administered 2013-12-30: 2 mg via INTRAMUSCULAR
  Filled 2013-12-30: qty 2

## 2013-12-30 NOTE — ED Notes (Signed)
He states "im here for back pain. i was here for the same thing last week and i took the medicine they gave me but it didn't help."

## 2013-12-30 NOTE — Discharge Instructions (Signed)
Chronic Pain Discharge Instructions  Emergency care providers appreciate that many patients coming to us are in severe pain and we wish to address their pain in the safest, most responsible manner.  It is important to recognize however, that the proper treatment of chronic pain differs from that of the pain of injuries and acute illnesses.  Our goal is to provide quality, safe, personalized care and we thank you for giving us the opportunity to serve you. The use of narcotics and related agents for chronic pain syndromes may lead to additional physical and psychological problems.  Nearly as many people die from prescription narcotics each year as die from car crashes.  Additionally, this risk is increased if such prescriptions are obtained from a variety of sources.  Therefore, only your primary care physician or a pain management specialist is able to safely treat such syndromes with narcotic medications long-term.    Documentation revealing such prescriptions have been sought from multiple sources may prohibit us from providing a refill or different narcotic medication.  Your name may be checked first through the [facility] Controlled Substances Reporting System.  This database is a record of controlled substance medication prescriptions that the patient has received.  This has been established by [facility] in an effort to eliminate the dangerous, and often life threatening, practice of obtaining multiple prescriptions from different medical providers.   If you have a chronic pain syndrome (i.e. chronic headaches, recurrent back or neck pain, dental pain, abdominal or pelvis pain without a specific diagnosis, or neuropathic pain such as fibromyalgia) or recurrent visits for the same condition without an acute diagnosis, you may be treated with non-narcotics and other non-addictive medicines.  Allergic reactions or negative side effects that may be reported by a patient to such medications will not  typically lead to the use of a narcotic analgesic or other controlled substance as an alternative. Please see a medicaid case worker at [place] [facility]. Social services  [address] [place], [place] [phone]  Patients managing chronic pain with a personal physician should have provisions in place for breakthrough pain.  If you are in crisis, you should call your physician.  If your physician directs you to the emergency department, please have the doctor call and speak to our attending physician concerning your care.   When patients come to the Emergency Department (ED) with acute medical conditions in which the Emergency Department physician feels appropriate to prescribe narcotic or sedating pain medication, the physician will prescribe these in very limited quantities.  The amount of these medications will last only until you can see your primary care physician in his/her office.  Any patient who returns to the ED seeking refills should expect only non-narcotic pain medications.   In the event of an acute medical condition exists and the emergency physician feels it is necessary that the patient be given a narcotic or sedating medication -  a responsible adult driver should be present in the room prior to the medication being given by the nurse.   Prescriptions for narcotic or sedating medications that have been lost, stolen or expired will not be refilled in the Emergency Department.    Patients who have chronic pain may receive non-narcotic prescriptions until seen by their primary care physician.  It is every patients personal responsibility to maintain active prescriptions with his or her primary care physician or specialist.  SEEK IMMEDIATE MEDICAL ATTENTION IF: New numbness, tingling, weakness, or problem with the use of your  arms or legs.  Severe back pain not relieved with medications.  Change in bowel or bladder control.  Increasing pain in any areas of the body (such as  chest or abdominal pain).  Shortness of breath, dizziness or fainting.  Nausea (feeling sick to your stomach), vomiting, fever, or sweats.   Emergency Department Resource Guide 1) Find a Doctor and Pay Out of Pocket Although you won't have to find out who is covered by your insurance plan, it is a good idea to ask around and get recommendations. You will then need to call the office and see if the doctor you have chosen will accept you as a new patient and what types of options they offer for patients who are self-pay. Some doctors offer discounts or will set up payment plans for their patients who do not have insurance, but you will need to ask so you aren't surprised when you get to your appointment.  2) Contact Your Local Health Department Not all health departments have doctors that can see patients for sick visits, but many do, so it is worth a call to see if yours does. If you don't know where your local health department is, you can check in your phone book. The CDC also has a tool to help you locate your state's health department, and many state websites also have listings of all of their local health departments.  3) Find a Walk-in Clinic If your illness is not likely to be very severe or complicated, you may want to try a walk in clinic. These are popping up all over the country in pharmacies, drugstores, and shopping centers. They're usually staffed by nurse practitioners or physician assistants that have been trained to treat common illnesses and complaints. They're usually fairly quick and inexpensive. However, if you have serious medical issues or chronic medical problems, these are probably not your best option.  No Primary Care Doctor: - Call Health Connect at  [phone] - they can help you locate a primary care doctor that  accepts your insurance, provides certain services, etc. - Physician Referral Service- [phone]  Chronic Pain Problems: Organization          Address  Phone   Notes  [facility]  [phone] Patients need to be referred by their primary care doctor.   Medication Assistance: Organization         Address  Phone   Notes  [facility] Medication [facility] [address] [place] [place], [place] [address] [phone] --Must be a resident of [facility] -- Must have NO insurance coverage whatsoever (no Medicaid/ Medicare, etc.) -- The pt. MUST have a primary care doctor that directs their care regularly and follows them in the community   [facility]ist  [phone]   [employer]  [phone]    Agencies that provide inexpensive medical care: Organization         Address  Phone   Notes  [facility]  [phone]   [facility]    [phone]   [facility] [address] [place], [place] [address] [phone]   [facility] [address], [place] [phone]   Planned Parenthood    [phone]   [facility]    [phone]   [facility]  [address], [place] Phone:  [phone], Fax:  [phone] Hours of Operation:  9 am - 6 pm, M-F.  Also accepts Medicaid/Medicare and self-pay.  [facility]  [address], [place] Phone: [phone], Fax: [phone]. Hours of Operation:  8:30 am - 5:30 pm, M-F.  Also accepts Medicaid and self-pay.  [facility] [place] [address] [place] Phone: [phone]   [facility] [address] Natasha Bence [place], [place] [phone], Ext. [phone] Mondays & Thursdays: 7-9 AM.  First 15 patients are seen on a first come, first serve basis.    Medicaid-accepting [facility] Providers:  Organization         Address  Phone   Notes  [facility] [address], [place] [phone] Also accepts self-pay patients.  [facility] [address] Laurell Josephs [place], [place]  [phone]   [facility] [address], [place] [phone]   [facility] [address], [place] [phone]   Renaye Rakers [address], [place]   [phone] Only accepts [place] Access [place] patients after they have their name applied to their card.   Self-Pay (no insurance) in [facility]:  Organization         Address  Phone   Notes  Sickle Cell Patients, [facility] [address] [place], [place] [phone]   [facility] [address] [place], [place] [phone]   [facility] [place]  [address], [place] [phone]   [facility]/Dr. Osei-Bonsu  [address], [place] or [address], [place] [phone] Phone number for both [place] and [place] locations is the same.  [facility] [address] [place] [phone]   [facility] [address] [place] or [address] [phone]   [facility] [address] [place] [phone], phone; [phone], fax Sees patients 1st and 3rd Saturday of every month.  Must not qualify for public or private insurance (i.e. Medicaid, Medicare, [place] Health Choice, Veterans' Benefits)  Household income should be no more than 200% of the poverty level The clinic cannot treat you if you are pregnant or think you are pregnant  Sexually transmitted diseases are not treated at the clinic.    Dental Care: Organization         Address  Phone  Notes  [facility] Department of [facility] [address] [place], [place] [phone] Accepts children up to age 54 who are enrolled in [place] or [place] Health Choice; pregnant women with a Medicaid card; and  children who have applied for Medicaid or [place] Health Choice, but were declined, whose parents can pay a reduced fee at time of service.  [facility] Department of [facility]  [address], [place] [phone] Accepts children up to age 61 who are enrolled in [place] or [place] Health Choice; pregnant women with a Medicaid card; and children who have applied for Medicaid or [place] Health Choice, but were declined, whose parents can pay a reduced fee at time of service.  [facility]  [address] [place], [place] [phone] Patients are seen by appointment only. Walk-ins are not accepted. [facility] will see patients 18 years  of age and older. Monday - Tuesday (8am-5pm) Most Wednesdays (8:30-5pm) $30 per visit, cash only  [facility]  [address], [facility] [phone] Patients are seen by appointment only. Walk-ins are not accepted. [facility] will see patients 45 years of age and older. One Wednesday Evening (Monthly: Volunteer Based).  $30 per visit, cash only  [employer] [facility]  [phone] for adults; Children under age 254, call Graduate Pediatric Dentistry at [phone]. Children aged 374-14, please call [phone] to request a pediatric application.  Dental services are provided in all areas of dental care including fillings, crowns and bridges, complete and partial dentures, implants, gum treatment, root canals, and extractions. Preventive care is also provided. Treatment is provided to both adults and children. Patients are selected via a lottery and there is often a waiting list.   [facility] [address], [place]  [phone] [email or web address]   [facility] [address], [place], [place] [phone], Ext. [phone] Second and Fourth Thursday of each month, opens at 6:30 AM; Clinic ends at 9 AM.  Patients are seen on a first-come first-served  basis, and a limited number are seen during each clinic.   [facility]  [address], [place], [place] [phone]   Eligibility Requirements You must have lived in [place], or [place] for at least the last three months.   You cannot be eligible for state or federal sponsored [place]healthcare insurance, including CIGNAVeterans Administration, [place]icaid, or [employer]icare.   You generally cannot be eligible for healthcare insurance through your employer.    How to apply: Eligibility screenings are held every Tuesday and Wednesday afternoon from 1:00 pm until 4:00 pm. You do not need an appointment for the interview!  [facility] [address], [place], [place] [phone]   [facility]  [phone]   [facility]  [phone]   [facility]  [phone]    Behavioral Health Resources in the Community: Intensive Outpatient Programs Organization         Address  Phone  Notes  [facility] [address], [place], [place] [phone]   [facility] [address], [place], [place] [phone]   [facility] [address], [place], [place]  [phone]   [facility] [address],  [place], [place] [phone] or [phone]   Substance Abuse Resources Organization         Address  Phone  Notes  [facility]  [phone]   [facility]  [phone]   [facility]  [phone]   [facility]  [phone]   Residential & Outpatient Substance Abuse Program  [phone]   Psychological Services Organization         Address  Phone  Notes  [facility]  [phone]   [facility]  [phone]- [phone]   [facility] [address], [place] [phone] or [phone]    Mobile Crisis Teams Organization          Address  Phone  Notes  [facility], Mobile Crisis Care Unit  [phone]   [facility]  [address] [place], [place] [phone]   [facility] [address] [place] [phone]    Self-Help/Support Groups Organization  Address  Phone             Notes  [place] [place] - variety of support groups  [place] Call for more information  Narcotics Anonymous (NA), [facility] [address], [employer] [place]  2 meetings at this location   Special educational needs teacher         Address  Phone  Notes  [facility] [place],    [place]  [phone]   [facility]  [address] [place] [phone], [place], [place]   [place], [place] [phone] Admissions: 8am-3pm M-F  Incentives Substance [place] [address]    [place], [place] [phone]   [facility] [address] [place], [place], [place]   [facility] [address]  [place], [place]   [facility] - Intensive Outpatient [address], [place], [place]   [facility] ([place]  [place], [place] [phone] or [phone]   [facility] (RTS) [address], [place], [place] Accepts Medicaid  [facility] [place] [address]  [place] [phone] Substance Abuse/Addiction Treatment   [facility] Organization         Address  Phone  Notes  [facility]  [phone]   Domenic Schwab, PhD [address] Arlis Porta [place], [place]   [phone] or [phone]   [facility] [place], [place] [phone]   [facility] [address], [place], [place] [phone] Insurance/Medicaid/sponsorship  through [facility] and Families [address], [address] [place]                                    [place], [place] [phone] [place] [address], [place] [phone]    Dr. Adele Schilder  [phone]   [facility]. 1) [address] [place] 2) [place] 3)  [place] [address], [place] [phone]  [phone]   [place] [phone] or [phone] (After Hours)

## 2013-12-30 NOTE — ED Notes (Signed)
C/O chronic back pain

## 2013-12-30 NOTE — ED Notes (Signed)
Pt here for the sixth time for chronic back pain. Had back sx in [place] in 1995 and 2003. No PCP.

## 2013-12-30 NOTE — ED Provider Notes (Signed)
CSN: [id number]     Arrival date & time 12/30/13  1014 History  This chart was scribed for Arthor Captain, PA-C, non-physician practitioner working with Juliet Rude. Rubin Payor, MD by Nicholos Johns, ED scribe. This patient was seen in room TR10C/TR10C and the patient's care was started at 11:22 AM.    Chief Complaint  Patient presents with  . Back Pain   The history is provided by the patient. No language interpreter was used.   HPI Comments: John Duffy is a 43 y.o. male w/ hx of chronic back pain, arthritis, and d presents to the Emergency Department complaining of gradually worsening back pain. Decreased ROM with flexion and extension. Worsened since he fell down some stairs 2 months ago. Reports difficulty pushing for BM due to pain; no pain with urination. No loss of bowel/bladder control. Baseline right sided weakness due to stroke. Injured on the job in 1998. Had first back surgery that year. Second back surgery was in 2013. States he is scheduled for a third surgery soon. Sent for MRI in [place] he states showed severe Arthritis around the spine. Pt is on Medicaid and states he cannot obtain a PCP. States he cannot find anyone to follow him. Denies any illicit drug usage. Denies fever.   Past Medical History  Diagnosis Date  . Stroke     brain aneurysm  . Hypertension   . Migraines   . Hypercholesteremia   . Asthma   . Chronic back pain   . Aneurysm    Past Surgical History  Procedure Laterality Date  . Back surgery     Family History  Problem Relation Age of Onset  . Diabetes Mother   . Hypertension Mother    History  Substance Use Topics  . Smoking status: Former Smoker    Types: Cigarettes  . Smokeless tobacco: Not on file  . Alcohol Use: No    Review of Systems  Musculoskeletal: Positive for back pain.   Allergies  Review of patient's allergies indicates no known allergies.  Home Medications   Prior to Admission medications   Medication Sig Start Date  End Date Taking? Authorizing Provider  aspirin 81 MG tablet Take 81 mg by mouth daily.    Historical Provider, MD  lisinopril-hydrochlorothiazide (PRINZIDE,ZESTORETIC) 20-12.5 MG per tablet Take 1 tablet by mouth daily.    Historical Provider, MD  methocarbamol (ROBAXIN) 500 MG tablet Take 1 tablet (500 mg total) by mouth every 8 (eight) hours as needed for muscle spasms. 12/26/13   Mercedes Strupp Camprubi-Soms, PA-C  naproxen (NAPROSYN) 500 MG tablet Take 1 tablet (500 mg total) by mouth 2 (two) times daily as needed for mild pain, moderate pain or headache (TAKE WITH MEALS.). 12/26/13   Mercedes Strupp Camprubi-Soms, PA-C  nebivolol (BYSTOLIC) 5 MG tablet Take 5 mg by mouth daily.    Historical Provider, MD  oxyCODONE-acetaminophen (PERCOCET/ROXICET) 5-325 MG per tablet Take 1 tablet by mouth every 4 (four) hours as needed for moderate pain or severe pain. 12/22/13   Trixie Dredge, PA-C  predniSONE (DELTASONE) 20 MG tablet 3 tabs po daily x 4 days 12/26/13   [place] Camprubi-Soms, PA-C   Triage vitals: BP 165/109  Pulse 75  Temp(Src) 98 F (36.7 C) (Oral)  Resp 16  Wt 174 lb 4 oz (79.039 kg)  SpO2 100%  Physical Exam  Nursing note and vitals reviewed. Constitutional: He is oriented to person, place, and time. He appears well-developed and well-nourished. No distress.  HENT:  Head: Normocephalic  and atraumatic.  Eyes: Conjunctivae and EOM are normal.  Neck: Neck supple. No tracheal deviation present.  Cardiovascular: Normal rate.   Pulmonary/Chest: Effort normal. No respiratory distress.  Musculoskeletal: Normal range of motion.  Clear spasm of the right lumbar paraspinals. Tender to palpation midline and in tissue surrounding. Tender in the right glute.   Neurological: He is alert and oriented to person, place, and time.  Skin: Skin is warm and dry.  Psychiatric: He has a normal mood and affect. His behavior is normal.    ED Course  Procedures (including critical care  time) DIAGNOSTIC STUDIES: Oxygen Saturation is 100% on room air, normal by my interpretation.    COORDINATION OF CARE: At 11:29 AM: Discussed treatment plan with patient which includes short prescription for pain medication. Patient agrees.    Labs Review Labs Reviewed - No data to display  Imaging Review No results found.   EKG Interpretation None      MDM   Final diagnoses:  Chronic back pain greater than 3 months duration   .11:39 AM BP 170/110  Pulse 71  Temp(Src) 98 F (36.7 C) (Oral)  Resp 16  Wt 174 lb 4 oz (79.039 kg)  SpO2 100%  Patient hypertensive. Difficult to tell if his hypertension is secondary to his acute pain. Patient did not take  His antihypertensive meds today.  I have discussed the fact that the patient needs followup for these issues.with pcp and specialist. [facility] regarding est Medicaid PCP. Patient is having great difficulty and his back is in acute spasm. Will give pain meds today.   Patient with back pain.  No neurological deficits and normal neuro exam.  Patient can walk but states is painful.  No loss of bowel or bladder control.  No concern for cauda equina.  No fever, night sweats, weight loss, h/o cancer, IVDU.  RICE protocol and pain medicine indicated and discussed with patient.    I personally performed the services described in this documentation, which was scribed in my presence. The recorded information has been reviewed and is accurate.     Arthor CaptainAbigail Eri Mcevers, PA-C 12/30/13 1144

## 2013-12-31 NOTE — ED Provider Notes (Signed)
Medical screening examination/treatment/procedure(s) were performed by non-physician practitioner and as supervising physician I was immediately available for consultation/collaboration.   EKG Interpretation None       Caraline Deutschman R. Crista Nuon, MD 12/31/13 0659 

## 2014-02-20 ENCOUNTER — Encounter (HOSPITAL_COMMUNITY): Payer: Self-pay | Admitting: Emergency Medicine

## 2014-02-20 ENCOUNTER — Emergency Department (HOSPITAL_COMMUNITY)
Admission: EM | Admit: 2014-02-20 | Discharge: 2014-02-20 | Disposition: A | Discharge status: Home / Self Care | Payer: Medicare PPO | Attending: Emergency Medicine | Admitting: Emergency Medicine

## 2014-02-20 DIAGNOSIS — G8929 Other chronic pain: Secondary | ICD-10-CM | POA: Insufficient documentation

## 2014-02-20 DIAGNOSIS — Z8673 Personal history of transient ischemic attack (TIA), and cerebral infarction without residual deficits: Secondary | ICD-10-CM | POA: Insufficient documentation

## 2014-02-20 DIAGNOSIS — M545 Low back pain, unspecified: Secondary | ICD-10-CM | POA: Diagnosis not present

## 2014-02-20 DIAGNOSIS — M549 Dorsalgia, unspecified: Secondary | ICD-10-CM | POA: Insufficient documentation

## 2014-02-20 DIAGNOSIS — Z87891 Personal history of nicotine dependence: Secondary | ICD-10-CM | POA: Diagnosis not present

## 2014-02-20 DIAGNOSIS — I10 Essential (primary) hypertension: Secondary | ICD-10-CM | POA: Diagnosis not present

## 2014-02-20 DIAGNOSIS — Z8669 Personal history of other diseases of the nervous system and sense organs: Secondary | ICD-10-CM | POA: Insufficient documentation

## 2014-02-20 DIAGNOSIS — Z7982 Long term (current) use of aspirin: Secondary | ICD-10-CM | POA: Insufficient documentation

## 2014-02-20 DIAGNOSIS — Z862 Personal history of diseases of the blood and blood-forming organs and certain disorders involving the immune mechanism: Secondary | ICD-10-CM | POA: Diagnosis not present

## 2014-02-20 DIAGNOSIS — Z79899 Other long term (current) drug therapy: Secondary | ICD-10-CM | POA: Diagnosis not present

## 2014-02-20 DIAGNOSIS — J45909 Unspecified asthma, uncomplicated: Secondary | ICD-10-CM | POA: Insufficient documentation

## 2014-02-20 DIAGNOSIS — Z9889 Other specified postprocedural states: Secondary | ICD-10-CM | POA: Insufficient documentation

## 2014-02-20 DIAGNOSIS — IMO0002 Reserved for concepts with insufficient information to code with codable children: Secondary | ICD-10-CM | POA: Diagnosis not present

## 2014-02-20 DIAGNOSIS — Z8639 Personal history of other endocrine, nutritional and metabolic disease: Secondary | ICD-10-CM | POA: Insufficient documentation

## 2014-02-20 MED ORDER — CYCLOBENZAPRINE HCL 10 MG PO TABS: 10.0000 mg | ORAL_TABLET | Freq: Two times a day (BID) | ORAL | Status: DC | PRN

## 2014-02-20 MED ORDER — OXYCODONE-ACETAMINOPHEN 5-325 MG PO TABS
2.0000 | ORAL_TABLET | Freq: Once | ORAL | Status: AC
  Administered 2014-02-20: 2 via ORAL
  Filled 2014-02-20: qty 2

## 2014-02-20 NOTE — ED Notes (Signed)
Hx of chronic back pain with surgery x 2 in [place]. States has finally gotten in with a PCP with appointment in Jan of 2016. States he spoke with his surgeon in [place] last week and is planning to have a third surgery.

## 2014-02-20 NOTE — Discharge Instructions (Signed)
Chronic Pain Discharge Instructions  Emergency care providers appreciate that many patients coming to [place] are in severe pain and we wish to address their pain in the safest, most responsible manner.  It is important to recognize however, that the proper treatment of chronic pain differs from that of the pain of injuries and acute illnesses.  Our goal is to provide quality, safe, personalized care and we thank you for giving [place] the opportunity to serve you. The use of narcotics and related agents for chronic pain syndromes may lead to additional physical and psychological problems.  Nearly as many people die from prescription narcotics each year as die from car crashes.  Additionally, this risk is increased if such prescriptions are obtained from a variety of sources.  Therefore, only your primary care physician or a pain management specialist is able to safely treat such syndromes with narcotic medications long-term.    Documentation revealing such prescriptions have been sought from multiple sources may prohibit [place] from providing a refill or different narcotic medication.  Your name may be checked first through the [facility] Controlled Substances Reporting System.  This database is a record of controlled substance medication prescriptions that the patient has received.  This has been established by [facility] in an effort to eliminate the dangerous, and often life threatening, practice of obtaining multiple prescriptions from different medical providers.   If you have a chronic pain syndrome (i.e. chronic headaches, recurrent back or neck pain, dental pain, abdominal or pelvis pain without a specific diagnosis, or neuropathic pain such as fibromyalgia) or recurrent visits for the same condition without an acute diagnosis, you may be treated with non-narcotics and other non-addictive medicines.  Allergic reactions or negative side effects that may be reported by a patient to such medications will not  typically lead to the use of a narcotic analgesic or other controlled substance as an alternative.   Patients managing chronic pain with a personal physician should have provisions in place for breakthrough pain.  If you are in crisis, you should call your physician.  If your physician directs you to the emergency department, please have the doctor call and speak to our attending physician concerning your care.   When patients come to the Emergency Department (ED) with acute medical conditions in which the Emergency Department physician feels appropriate to prescribe narcotic or sedating pain medication, the physician will prescribe these in very limited quantities.  The amount of these medications will last only until you can see your primary care physician in his/her office.  Any patient who returns to the ED seeking refills should expect only non-narcotic pain medications.   In the event of an acute medical condition exists and the emergency physician feels it is necessary that the patient be given a narcotic or sedating medication -  a responsible adult driver should be present in the room prior to the medication being given by the nurse.   Prescriptions for narcotic or sedating medications that have been lost, stolen or expired will not be refilled in the Emergency Department.    Patients who have chronic pain may receive non-narcotic prescriptions until seen by their primary care physician.  It is every patients personal responsibility to maintain active prescriptions with his or her primary care physician or specialist.  Back Exercises Back exercises help treat and prevent back injuries. The goal of back exercises is to increase the strength of your abdominal and back muscles and the flexibility of your back. These exercises  should be started when you no longer have back pain. Back exercises include:  Pelvic Tilt. Lie on your back with your knees bent. Tilt your pelvis until the lower part of  your back is against the floor. Hold this position 5 to 10 sec and repeat 5 to 10 times.  Knee to Chest. Pull first 1 knee up against your chest and hold for 20 to 30 seconds, repeat this with the other knee, and then both knees. This may be done with the other leg straight or bent, whichever feels better.  Sit-Ups or Curl-Ups. Bend your knees 90 degrees. Start with tilting your pelvis, and do a partial, slow sit-up, lifting your trunk only 30 to 45 degrees off the floor. Take at least 2 to 3 seconds for each sit-up. Do not do sit-ups with your knees out straight. If partial sit-ups are difficult, simply do the above but with only tightening your abdominal muscles and holding it as directed.  Hip-Lift. Lie on your back with your knees flexed 90 degrees. Push down with your feet and shoulders as you raise your hips a couple inches off the floor; hold for 10 seconds, repeat 5 to 10 times.  Back arches. Lie on your stomach, propping yourself up on bent elbows. Slowly press on your hands, causing an arch in your low back. Repeat 3 to 5 times. Any initial stiffness and discomfort should lessen with repetition over time.  Shoulder-Lifts. Lie face down with arms beside your body. Keep hips and torso pressed to floor as you slowly lift your head and shoulders off the floor. Do not overdo your exercises, especially in the beginning. Exercises may cause you some mild back discomfort which lasts for a few minutes; however, if the pain is more severe, or lasts for more than 15 minutes, do not continue exercises until you see your caregiver. Improvement with exercise therapy for back problems is slow.  See your caregivers for assistance with developing a proper back exercise program. Document Released: 07/08/2004 Document Revised: 08/23/2011 Document Reviewed: 04/01/2011 [facility] Patient Information 2015 [place], [place]. This information is not intended to replace advice given to you by your health care provider.  Make sure you discuss any questions you have with your health care provider.

## 2014-02-20 NOTE — ED Provider Notes (Signed)
Medical screening examination/treatment/procedure(s) were performed by non-physician practitioner and as supervising physician I was immediately available for consultation/collaboration.   EKG Interpretation None       Vanetta Mulders, MD 02/20/14 1232

## 2014-02-20 NOTE — ED Provider Notes (Signed)
CSN: [id number]     Arrival date & time 02/20/14  1029 History  This chart was scribed for non-physician practitioner, Roxy Horseman, PA-C working with Vanetta Mulders, MD by Greggory Stallion, ED scribe. This patient was seen in room TR05C/TR05C and the patient's care was started at 10:56 AM.   Chief Complaint  Patient presents with  . Back Pain   The history is provided by the patient. No language interpreter was used.   HPI Comments: John Duffy is a 43 y.o. male who presents to the Emergency Department complaining of chronic lower back pain that radiates into his legs that worsened about 2 months ago after a fall. Certain movements worsen the pain. Pt has had two back surgeries in the past and states he recently spoke with his surgeon about another. He recently got into a new PCP but does not have an appointment until January 2016. States he has none of his normal pain medications. Denies fever, bowel or bladder incontinence, weakness in arms or legs.   Past Medical History  Diagnosis Date  . Stroke     brain aneurysm  . Hypertension   . Migraines   . Hypercholesteremia   . Asthma   . Chronic back pain   . Aneurysm    Past Surgical History  Procedure Laterality Date  . Back surgery     Family History  Problem Relation Age of Onset  . Diabetes Mother   . Hypertension Mother    History  Substance Use Topics  . Smoking status: Former Smoker    Types: Cigarettes  . Smokeless tobacco: Not on file  . Alcohol Use: No    Review of Systems  Constitutional: Negative for fever.  HENT: Negative for congestion.   Eyes: Negative for redness.  Respiratory: Negative for shortness of breath.   Cardiovascular: Negative for chest pain.  Gastrointestinal: Negative for abdominal distention.  Genitourinary:       Negative for bowel or bladder incontinence.  Musculoskeletal: Positive for back pain and myalgias.  Skin: Negative for rash.  Neurological: Negative for weakness.   Psychiatric/Behavioral: Negative for confusion.   Allergies  Tramadol and Tylenol  Home Medications   Prior to Admission medications   Medication Sig Start Date End Date Taking? Authorizing Provider  aspirin 81 MG tablet Take 81 mg by mouth daily.    Historical Provider, MD  baclofen (LIORESAL) 20 MG tablet Take 1 tablet (20 mg total) by mouth 3 (three) times daily. 12/30/13   Arthor Captain, PA-C  docusate sodium (COLACE) 100 MG capsule Take 1 capsule (100 mg total) by mouth every 12 (twelve) hours. 12/30/13   Arthor Captain, PA-C  lisinopril-hydrochlorothiazide (PRINZIDE,ZESTORETIC) 20-12.5 MG per tablet Take 1 tablet by mouth daily.    Historical Provider, MD  methocarbamol (ROBAXIN) 500 MG tablet Take 1 tablet (500 mg total) by mouth every 8 (eight) hours as needed for muscle spasms. 12/26/13   Mercedes Strupp Camprubi-Soms, PA-C  naproxen (NAPROSYN) 500 MG tablet Take 1 tablet (500 mg total) by mouth 2 (two) times daily as needed for mild pain, moderate pain or headache (TAKE WITH MEALS.). 12/26/13   Mercedes Strupp Camprubi-Soms, PA-C  oxyCODONE-acetaminophen (PERCOCET) 5-325 MG per tablet Take 1-2 tablets by mouth every 4 (four) hours as needed. 12/30/13   Arthor Captain, PA-C  predniSONE (DELTASONE) 20 MG tablet 3 tabs po daily x 4 days 12/26/13   Mercedes Strupp Camprubi-Soms, PA-C   BP 155/103  Pulse 69  Temp(Src) 98.2 F (36.8 C) (Oral)  SpO2 100%  Physical Exam  Nursing note and vitals reviewed. Constitutional: He is oriented to person, place, and time. He appears well-developed and well-nourished. No distress.  HENT:  Head: Normocephalic and atraumatic.  Eyes: Conjunctivae and EOM are normal. Right eye exhibits no discharge. Left eye exhibits no discharge. No scleral icterus.  Neck: Normal range of motion. Neck supple. No tracheal deviation present.  Cardiovascular: Normal rate, regular rhythm and normal heart sounds.  Exam reveals no gallop and no friction rub.   No murmur  heard. Pulmonary/Chest: Effort normal and breath sounds normal. No stridor. No respiratory distress. He has no wheezes.  Abdominal: Soft. He exhibits no distension. There is no tenderness.  Musculoskeletal: Normal range of motion. He exhibits no edema.  Lumbar paraspinal muscles tender to palpation, no bony tenderness, step-offs, or gross abnormality or deformity of spine, patient is able to ambulate, moves all extremities  Bilateral great toe extension intact Bilateral plantar/dorsiflexion intact  Neurological: He is alert and oriented to person, place, and time. He has normal reflexes.  Sensation and strength intact bilaterally Symmetrical reflexes  Skin: Skin is warm and dry. He is not diaphoretic.  Psychiatric: He has a normal mood and affect. His behavior is normal. Judgment and thought content normal.    ED Course  Procedures (including critical care time)  DIAGNOSTIC STUDIES: Oxygen Saturation is 100% on RA, normal by my interpretation.    COORDINATION OF CARE: 10:59 AM-Advised pt the ED is not for chronic pain medications. Discussed treatment plan which includes percocet in the ED and prescription for robaxin with pt at bedside and pt agreed to plan.   Labs Review Labs Reviewed - No data to display  Imaging Review No results found.   EKG Interpretation None      MDM   Final diagnoses:  Chronic back pain    Patient with back pain.  No neurological deficits and normal neuro exam.  Patient is ambulatory.  No loss of bowel or bladder control.  Doubt cauda equina.  Denies fever,  doubt epidural abscess or other lesion. Recommend back exercises, stretching, RICE, and will treat with a short course of flexeril.  Patient is seeking to establish care with pain management, but has been unable to.  At this time, I do not feel that additional prescription narcotic pain meds will be beneficial from the ED.  Encouraged the patient that there could be a need for additional  workup and/or imaging such as MRI, if the symptoms do not resolve. Patient advised that if the back pain does not resolve, or radiates, this could progress to more serious conditions and is encouraged to follow-up with PCP or orthopedics within 2 weeks.     I personally performed the services described in this documentation, which was scribed in my presence. The recorded information has been reviewed and is accurate.   Roxy Horseman, PA-C 02/20/14 1106

## 2014-03-30 ENCOUNTER — Encounter (HOSPITAL_COMMUNITY): Payer: Self-pay | Admitting: Emergency Medicine

## 2014-03-30 ENCOUNTER — Emergency Department (HOSPITAL_COMMUNITY): Payer: Medicare PPO

## 2014-03-30 ENCOUNTER — Emergency Department (INDEPENDENT_AMBULATORY_CARE_PROVIDER_SITE_OTHER)
Admission: EM | Admit: 2014-03-30 | Discharge: 2014-03-30 | Disposition: A | Discharge status: Other Acute Inpatient Hospital | Payer: Medicare PPO | Source: Home / Self Care | Attending: Family Medicine | Admitting: Family Medicine

## 2014-03-30 ENCOUNTER — Emergency Department (HOSPITAL_COMMUNITY)
Admission: EM | Admit: 2014-03-30 | Discharge: 2014-03-30 | Disposition: A | Discharge status: Home / Self Care | Payer: Medicare PPO | Attending: Emergency Medicine | Admitting: Emergency Medicine

## 2014-03-30 DIAGNOSIS — Z79899 Other long term (current) drug therapy: Secondary | ICD-10-CM | POA: Insufficient documentation

## 2014-03-30 DIAGNOSIS — Y9389 Activity, other specified: Secondary | ICD-10-CM | POA: Insufficient documentation

## 2014-03-30 DIAGNOSIS — W109XXA Fall (on) (from) unspecified stairs and steps, initial encounter: Secondary | ICD-10-CM | POA: Insufficient documentation

## 2014-03-30 DIAGNOSIS — Y9289 Other specified places as the place of occurrence of the external cause: Secondary | ICD-10-CM | POA: Insufficient documentation

## 2014-03-30 DIAGNOSIS — J45909 Unspecified asthma, uncomplicated: Secondary | ICD-10-CM | POA: Insufficient documentation

## 2014-03-30 DIAGNOSIS — S3992XA Unspecified injury of lower back, initial encounter: Secondary | ICD-10-CM | POA: Diagnosis present

## 2014-03-30 DIAGNOSIS — M545 Low back pain: Secondary | ICD-10-CM

## 2014-03-30 DIAGNOSIS — Z7982 Long term (current) use of aspirin: Secondary | ICD-10-CM | POA: Insufficient documentation

## 2014-03-30 DIAGNOSIS — G8929 Other chronic pain: Secondary | ICD-10-CM | POA: Insufficient documentation

## 2014-03-30 DIAGNOSIS — Z87891 Personal history of nicotine dependence: Secondary | ICD-10-CM | POA: Insufficient documentation

## 2014-03-30 DIAGNOSIS — Z8673 Personal history of transient ischemic attack (TIA), and cerebral infarction without residual deficits: Secondary | ICD-10-CM | POA: Insufficient documentation

## 2014-03-30 DIAGNOSIS — I10 Essential (primary) hypertension: Secondary | ICD-10-CM | POA: Diagnosis not present

## 2014-03-30 DIAGNOSIS — W19XXXA Unspecified fall, initial encounter: Secondary | ICD-10-CM

## 2014-03-30 DIAGNOSIS — M5441 Lumbago with sciatica, right side: Secondary | ICD-10-CM

## 2014-03-30 MED ORDER — MORPHINE SULFATE 4 MG/ML IJ SOLN
4.0000 mg | Freq: Once | INTRAMUSCULAR | Status: AC
  Administered 2014-03-30: 4 mg via INTRAMUSCULAR
  Filled 2014-03-30: qty 1

## 2014-03-30 MED ORDER — OXYCODONE HCL 5 MG PO TABS: 5.0000 mg | ORAL_TABLET | ORAL | Status: DC | PRN

## 2014-03-30 NOTE — ED Notes (Signed)
Pt   Reports he  John Duffy  Yesterday       And  inj  His  Lower  Back  And  Tailbone  Area          He   Ambulated  To  Room   With  A  Slow  Steady  Gait      He  Reports  A  History  Of  Back    Problems  In  Past

## 2014-03-30 NOTE — ED Provider Notes (Signed)
CSN: [id number]     Arrival date & time 03/30/14  1652 History   First MD Initiated Contact with Patient 03/30/14 1757     Chief Complaint  Patient presents with  . Fall   (Consider location/radiation/quality/duration/timing/severity/associated sxs/prior Treatment) Patient is a 43 y.o. male presenting with fall. The history is provided by the patient.  Fall This is a new problem. The current episode started 12 to 24 hours ago (chronic back pain s/p surg x2 , fell down 9  stairs last eve.). The problem has been gradually worsening. Pertinent negatives include no chest pain and no abdominal pain.    Past Medical History  Diagnosis Date  . Stroke     brain aneurysm  . Hypertension   . Migraines   . Hypercholesteremia   . Asthma   . Chronic back pain   . Aneurysm    Past Surgical History  Procedure Laterality Date  . Back surgery     Family History  Problem Relation Age of Onset  . Diabetes Mother   . Hypertension Mother    History  Substance Use Topics  . Smoking status: Former Smoker    Types: Cigarettes  . Smokeless tobacco: Not on file  . Alcohol Use: No    Review of Systems  Constitutional: Negative.   Cardiovascular: Negative for chest pain.  Gastrointestinal: Negative.  Negative for abdominal pain.  Genitourinary: Negative.   Musculoskeletal: Positive for back pain and gait problem.  Skin: Negative.     Allergies  Tramadol and Tylenol  Home Medications   Prior to Admission medications   Medication Sig Start Date End Date Taking? Authorizing Provider  aspirin 81 MG tablet Take 81 mg by mouth daily.    Historical Provider, MD  baclofen (LIORESAL) 20 MG tablet Take 1 tablet (20 mg total) by mouth 3 (three) times daily. 12/30/13   Arthor CaptainAbigail Harris, PA-C  cyclobenzaprine (FLEXERIL) 10 MG tablet Take 1 tablet (10 mg total) by mouth 2 (two) times daily as needed for muscle spasms. 02/20/14   Roxy Horsemanobert Browning, PA-C  docusate sodium (COLACE) 100 MG capsule Take 1  capsule (100 mg total) by mouth every 12 (twelve) hours. 12/30/13   Arthor CaptainAbigail Harris, PA-C  lisinopril-hydrochlorothiazide (PRINZIDE,ZESTORETIC) 20-12.5 MG per tablet Take 1 tablet by mouth daily.    Historical Provider, MD  methocarbamol (ROBAXIN) 500 MG tablet Take 1 tablet (500 mg total) by mouth every 8 (eight) hours as needed for muscle spasms. 12/26/13   Mercedes Strupp Camprubi-Soms, PA-C  naproxen (NAPROSYN) 500 MG tablet Take 1 tablet (500 mg total) by mouth 2 (two) times daily as needed for mild pain, moderate pain or headache (TAKE WITH MEALS.). 12/26/13   Mercedes Strupp Camprubi-Soms, PA-C  oxyCODONE-acetaminophen (PERCOCET) 5-325 MG per tablet Take 1-2 tablets by mouth every 4 (four) hours as needed. 12/30/13   Arthor CaptainAbigail Harris, PA-C  predniSONE (DELTASONE) 20 MG tablet 3 tabs po daily x 4 days 12/26/13   Mercedes Strupp Camprubi-Soms, PA-C   BP 147/98  Pulse 69  Temp(Src) 98.2 F (36.8 C) (Oral)  Resp 18  SpO2 99% Physical Exam  Nursing note and vitals reviewed. Constitutional: He is oriented to person, place, and time. He appears distressed.  Musculoskeletal: He exhibits tenderness.       Lumbar back: He exhibits decreased range of motion, tenderness, bony tenderness, pain and spasm. He exhibits normal pulse.       Back:  Neurological: He is alert and oriented to person, place, and time.  Skin: Skin  is warm and dry.    ED Course  Procedures (including critical care time) Labs Review Labs Reviewed - No data to display  Imaging Review No results found.   MDM   1. Right-sided low back pain with sciatica, sciatica laterality unspecified    Referred for acute/ chronic back pain after fall down 9 stairs last eve. [facility]Linna Hoff.    Reyhan D Tyee Vandevoorde, MD 03/30/14 [phone]

## 2014-03-30 NOTE — ED Provider Notes (Signed)
CSN: [id number]     Arrival date & time 03/30/14  1845 History   First MD Initiated Contact with Patient 03/30/14 2015     Chief Complaint  Patient presents with  . Back Pain  . Tailbone Pain     (Consider location/radiation/quality/duration/timing/severity/associated sxs/prior Treatment) HPI  John Duffy is a 43 y.o. male  With past medical history significant for CVA, hypertension, chronic low back pain complaining of low back pain status post slip and fall down 9 stairs yesterday. Patient was helping his 45 year old daughter who has Down's syndrome up the steps. Has residual right-sided hemiparesis status post CVA. States that this is at his baseline. Patient did have occipital/parietal head trauma in the fall, he did not lose consciousness, he has been off of blood thinners for over 7 months. He denies any change in vision, dysarthria, nausea vomiting, cervicalgia, chest pain, shortness of breath, abdominal pain, change in bowel or bladder habits. Patient states that his low back pain is severe, he is status post 2x lumbar surgeries in the remote past.   Past Medical History  Diagnosis Date  . Stroke     brain aneurysm  . Hypertension   . Migraines   . Hypercholesteremia   . Asthma   . Chronic back pain   . Aneurysm    Past Surgical History  Procedure Laterality Date  . Back surgery     Family History  Problem Relation Age of Onset  . Diabetes Mother   . Hypertension Mother    History  Substance Use Topics  . Smoking status: Former Smoker    Types: Cigarettes  . Smokeless tobacco: Not on file  . Alcohol Use: No    Review of Systems  10 systems reviewed and found to be negative, except as noted in the HPI.   Allergies  Tramadol and Tylenol  Home Medications   Prior to Admission medications   Medication Sig Start Date End Date Taking? Authorizing Provider  aspirin 81 MG tablet Take 81 mg by mouth daily.   Yes Historical Provider, MD    lisinopril-hydrochlorothiazide (PRINZIDE,ZESTORETIC) 20-12.5 MG per tablet Take 1 tablet by mouth daily.   Yes Historical Provider, MD  oxyCODONE (ROXICODONE) 5 MG immediate release tablet Take 1 tablet (5 mg total) by mouth every 4 (four) hours as needed. Take 1-2 tablets every 4-6 hours as needed for pain control 03/30/14   Ettamae Barkett, PA-C   BP 164/108  Pulse 64  Temp(Src) 98.2 F (36.8 C) (Oral)  Resp 16  SpO2 99% Physical Exam  Nursing note and vitals reviewed. Constitutional: He is oriented to person, place, and time. He appears well-developed and well-nourished. No distress.  HENT:  Head: Normocephalic and atraumatic.  Mouth/Throat: Oropharynx is clear and moist.  No abrasions or contusions.   No hemotympanum, battle signs or raccoon's eyes  No crepitance or tenderness to palpation along the orbital rim.  EOMI intact with no pain or diplopia  No abnormal otorrhea or rhinorrhea. Nasal septum midline.  No intraoral trauma.  Eyes: Conjunctivae and EOM are normal. Pupils are equal, round, and reactive to light.  Neck: Normal range of motion. Neck supple.  No midline C-spine  tenderness to palpation or step-offs appreciated. Patient has full range of motion without pain.   Cardiovascular: Normal rate, regular rhythm and intact distal pulses.   Pulmonary/Chest: Effort normal and breath sounds normal. No stridor. No respiratory distress. He has no wheezes. He has no rales. He exhibits no tenderness.  No TTP  or crepitance  Abdominal: Soft. Bowel sounds are normal. He exhibits no distension and no mass. There is no tenderness. There is no rebound and no guarding.  No Seatbelt Sign  Musculoskeletal: Normal range of motion. He exhibits no edema and no tenderness.       Back:  Pelvis stable. No deformity or TTP of major joints.   Good ROM  Patient is ambulatory with a limp  Neurological: He is alert and oriented to person, place, and time.   Distal sensation  intact  Strength is 3/5 to right lower extremity, 5 out of 5 to left.  DTRs decreased on the right  Skin: Skin is warm.  Psychiatric: He has a normal mood and affect.    ED Course  Procedures (including critical care time) Labs Review Labs Reviewed - No data to display  Imaging Review Dg Lumbar Spine Complete  03/30/2014   CLINICAL DATA:  Back pain, tailbone pain, fell yesterday going up stairs, initial evaluation  EXAM: LUMBAR SPINE - COMPLETE 4+ VIEW  COMPARISON:  None.  FINDINGS: Mild levoscoliosis lumbar spine. Normal anterior-posterior alignment. Mild multilevel degenerative disc disease. No fracture.  IMPRESSION: No acute findings   Electronically Signed   By: Esperanza Heiraymond  Rubner M.D.   On: 03/30/2014 22:34   Dg Sacrum/coccyx  03/30/2014   CLINICAL DATA:  Back pinky elbow pain fell yesterday initial evaluation  EXAM: SACRUM AND COCCYX - 2+ VIEW  COMPARISON:  None.  FINDINGS: There is no evidence of fracture or other focal bone lesions.  IMPRESSION: Negative.   Electronically Signed   By: Esperanza Heiraymond  Rubner M.D.   On: 03/30/2014 22:36     EKG Interpretation None      MDM   Final diagnoses:  Fall, initial encounter  Acute exacerbation of chronic low back pain    Filed Vitals:   03/30/14 1854 03/30/14 2017 03/30/14 2326  BP: 153/109 141/103 164/108  Pulse: 68 66 64  Temp: 97.9 F (36.6 C)  98.2 F (36.8 C)  TempSrc: Oral  Oral  Resp: 18 16 16   SpO2: 100% 97% 99%    Medications  morphine 4 MG/ML injection 4 mg (4 mg Intramuscular Given 03/30/14 2038)    John Duffy is a 43 y.o. male presenting with laceration of chronic low back pain status post fall down 9 steps. Neuro exam is at his baseline, he has a right lower extremity hemiparesis status post CVA. X-ray with no acute findings. Patient is ambulatory, has primary care followup in January.  Evaluation does not show pathology that would require ongoing emergent intervention or inpatient treatment. Pt is  hemodynamically stable and mentating appropriately. Discussed findings and plan with patient/guardian, who agrees with care plan. All questions answered. Return precautions discussed and outpatient follow up given.   Discharge Medication List as of 03/30/2014 11:21 PM    START taking these medications   Details  oxyCODONE (ROXICODONE) 5 MG immediate release tablet Take 1 tablet (5 mg total) by mouth every 4 (four) hours as needed. Take 1-2 tablets every 4-6 hours as needed for pain control, Starting 03/30/2014, Until Discontinued, Print             Wynetta Emeryicole Graiden Henes, PA-C 03/30/14 2346

## 2014-03-30 NOTE — Discharge Instructions (Signed)
Take oxycodone for breakthrough pain, do not drink alcohol, drive, care for children or do other critical tasks while taking oxycodone.  Do not hesitate to return to the emergency room for any new, worsening or concerning symptoms.  Please obtain primary care using resource guide below. But the minute you were seen in the emergency room and that they will need to obtain records for further outpatient management.   Fall Prevention and Home Safety Falls cause injuries and can affect all age groups. It is possible to use preventive measures to significantly decrease the likelihood of falls. There are many simple measures which can make your home safer and prevent falls. OUTDOORS  Repair cracks and edges of walkways and driveways.  Remove high doorway thresholds.  Trim shrubbery on the main path into your home.  Have good outside lighting.  Clear walkways of tools, rocks, debris, and clutter.  Check that handrails are not broken and are securely fastened. Both sides of steps should have handrails.  Have leaves, snow, and ice cleared regularly.  Use sand or salt on walkways during winter months.  In the garage, clean up grease or oil spills. BATHROOM  Install night lights.  Install grab bars by the toilet and in the tub and shower.  Use non-skid mats or decals in the tub or shower.  Place a plastic non-slip stool in the shower to sit on, if needed.  Keep floors dry and clean up all water on the floor immediately.  Remove soap buildup in the tub or shower on a regular basis.  Secure bath mats with non-slip, double-sided rug tape.  Remove throw rugs and tripping hazards from the floors. BEDROOMS  Install night lights.  Make sure a bedside light is easy to reach.  Do not use oversized bedding.  Keep a telephone by your bedside.  Have a firm chair with side arms to use for getting dressed.  Remove throw rugs and tripping hazards from the floor. KITCHEN  Keep handles  on pots and pans turned toward the center of the stove. Use back burners when possible.  Clean up spills quickly and allow time for drying.  Avoid walking on wet floors.  Avoid hot utensils and knives.  Position shelves so they are not too high or low.  Place commonly used objects within easy reach.  If necessary, use a sturdy step stool with a grab bar when reaching.  Keep electrical cables out of the way.  Do not use floor polish or wax that makes floors slippery. If you must use wax, use non-skid floor wax.  Remove throw rugs and tripping hazards from the floor. STAIRWAYS  Never leave objects on stairs.  Place handrails on both sides of stairways and use them. Fix any loose handrails. Make sure handrails on both sides of the stairways are as long as the stairs.  Check carpeting to make sure it is firmly attached along stairs. Make repairs to worn or loose carpet promptly.  Avoid placing throw rugs at the top or bottom of stairways, or properly secure the rug with carpet tape to prevent slippage. Get rid of throw rugs, if possible.  Have an electrician put in a light switch at the top and bottom of the stairs. OTHER FALL PREVENTION TIPS  Wear low-heel or rubber-soled shoes that are supportive and fit well. Wear closed toe shoes.  When using a stepladder, make sure it is fully opened and both spreaders are firmly locked. Do not climb a closed stepladder.  Add color or contrast paint or tape to grab bars and handrails in your home. Place contrasting color strips on first and last steps.  Learn and use mobility aids as needed. Install an electrical emergency response system.  Turn on lights to avoid dark areas. Replace light bulbs that burn out immediately. Get light switches that glow.  Arrange furniture to create clear pathways. Keep furniture in the same place.  Firmly attach carpet with non-skid or double-sided tape.  Eliminate uneven floor surfaces.  Select a  carpet pattern that does not visually hide the edge of steps.  Be aware of all pets. OTHER HOME SAFETY TIPS  Set the water temperature for 120 F (48.8 C).  Keep emergency numbers on or near the telephone.  Keep smoke detectors on every level of the home and near sleeping areas. Document Released: 05/21/2002 Document Revised: 11/30/2011 Document Reviewed: 08/20/2011 [facility] Patient Information 2015 [place], [place]. This information is not intended to replace advice given to you by your health care provider. Make sure you discuss any questions you have with your health care provider.    Emergency Department Resource Guide 1) Find a Doctor and Pay Out of Pocket Although you won't have to find out who is covered by your insurance plan, it is a good idea to ask around and get recommendations. You will then need to call the office and see if the doctor you have chosen will accept you as a new patient and what types of options they offer for patients who are self-pay. Some doctors offer discounts or will set up payment plans for their patients who do not have insurance, but you will need to ask so you aren't surprised when you get to your appointment.  2) Contact Your Local Health Department Not all health departments have doctors that can see patients for sick visits, but many do, so it is worth a call to see if yours does. If you don't know where your local health department is, you can check in your phone book. The CDC also has a tool to help you locate your state's health department, and many state websites also have listings of all of their local health departments.  3) Find a Walk-in Clinic If your illness is not likely to be very severe or complicated, you may want to try a walk in clinic. These are popping up all over the country in pharmacies, drugstores, and shopping centers. They're usually staffed by nurse practitioners or physician assistants that have been trained to treat common  illnesses and complaints. They're usually fairly quick and inexpensive. However, if you have serious medical issues or chronic medical problems, these are probably not your best option.  No Primary Care Doctor: - Call Health Connect at  [phone] - they can help you locate a primary care doctor that  accepts your insurance, provides certain services, etc. - Physician Referral Service- [phone]  Chronic Pain Problems: Organization         Address  Phone   Notes  [facility]  [phone] Patients need to be referred by their primary care doctor.   Medication Assistance: Organization         Address  Phone   Notes  [facility] Medication [facility] [address] [place], [place] [address] [phone] --Must be a resident of [facility] -- Must have NO insurance coverage whatsoever (no Medicaid/ Medicare, etc.) -- The pt. MUST have a primary care doctor that directs  their care regularly and follows them in the community   [facility]  [phone]   [facility]  [phone]    Agencies that provide inexpensive medical care: Organization         Address  Phone   Notes  [facility]  [phone]   [facility]    [phone]   [facility] [address] [place], [place] [address] [phone]   [facility] [address] [place] [phone]   Planned Parenthood    [phone]   [facility]    [phone]   [facility]  [address], [place] Phone:  [phone], Fax:  [phone] Hours of Operation:  9 am - 6 pm, M-F.  Also accepts Medicaid/Medicare and self-pay.  [facility]  [address], [place] Phone: [phone], Fax: [phone]. Hours of Operation:  8:30 am - 5:30 pm, M-F.  Also accepts Medicaid and self-pay.  [facility] [place]  [address], [place] Phone: [phone]   [facility] [address] Natasha Bence [place], [place] [phone], Ext. [phone] Mondays & Thursdays: 7-9 AM.  First 15 patients are seen on a first come, first serve basis.    Medicaid-accepting [facility] Providers:  Organization         Address  Phone   Notes  [facility] [address], [place] [phone] Also accepts self-pay patients.  [facility] [address] Laurell Josephs [place], [place]  [phone]   [facility] [address], [place] [phone]   [facility] [address] [place] [phone]   Renaye Rakers [address], [place]   [phone] Only accepts [place] patients after they have their name applied to their card.   Self-Pay (no insurance) in [facility]:  Organization         Address  Phone   Notes  Sickle Cell Patients, [facility] [address] [place], [place] [phone]   [facility] [address] [place], [place] [phone]   [facility] [place]  [address], [place] [phone]   [facility]/Dr. Osei-Bonsu  [address], [place] or [address], [place] [phone] Phone number for both [place] and [place] locations is the same.  [facility] [address] [place] [phone]   [facility] [address] [place] or [address] [phone]   [facility] [address], [place] [phone], phone; [phone], fax Sees patients 1st and 3rd Saturday of every month.  Must not qualify for public or private insurance (i.e. Medicaid, Medicare, [place] Health Choice, Veterans' Benefits)  Household income should be no more than 200% of the  poverty level The clinic cannot treat you if you are pregnant or think you are pregnant  Sexually transmitted diseases are not treated at the clinic.    Dental Care: Organization         Address  Phone  Notes  [facility] [address] [place], [place] [phone] Accepts children up to  age 45 who are enrolled in Medicaid or [place] Health Choice; pregnant women with a Medicaid card; and children who have applied for Medicaid or [place] Health Choice, but were declined, whose parents can pay a reduced fee at time of service.  [facility] [place]  [address], [place] [phone] Accepts children up to age 45 who are enrolled in [place] or [place] Health Choice; pregnant women with a Medicaid card; and children who have applied for Medicaid or [place] Health Choice, but were declined, whose parents can pay a reduced fee at time of service.  [facility]  [address], [place] [phone] Patients are seen by appointment only. Walk-ins are not accepted. [facility] will see patients 45 years of age and older. Monday - Tuesday (8am-5pm) Most Wednesdays (8:30-5pm) $30 per visit, cash only  [facility]  [address], [facility] [place] [phone] Patients are seen by appointment only. Walk-ins are not accepted. [facility] will see patients 45 years of age and older. One Wednesday Evening (Monthly: Volunteer Based).  $30 per visit, cash only  [facility]  [phone]) [phone] for adults; Children under age 744, call Graduate Pediatric Dentistry at [phone]. Children aged 934-14, please call [phone]) [phone] to request a pediatric application.  Dental services are provided in all areas of dental care including fillings, crowns and bridges, complete and partial dentures, implants, gum treatment, root canals, and extractions.  Preventive care is also provided. Treatment is provided to both adults and children. Patients are selected via a lottery and there is often a waiting list.   [facility] [address], [place]  [phone]) [phone] [email or web address]   [facility] [address], [place], [place] [phone], Ext. [phone] Second and Fourth Thursday of each month, opens at 6:30 AM; Clinic ends at 9 AM.  Patients are seen on a first-come first-served basis, and a limited number are seen during each clinic.   [facility]  [address], [place], [place] [phone]) [phone]   Eligibility Requirements You must have lived in [place], [place], or [place] counties for at least the last three months.   You cannot be eligible for state or federal sponsored [place] Cityhealthcare insurance, including CIGNAVeterans Administration, [place]icaid, or [employer] EntertainmentMedicare.   You generally cannot be eligible for healthcare insurance through your employer.    How to apply: Eligibility screenings are held every Tuesday and Wednesday afternoon from 1:00 pm until 4:00 pm. You do not need an appointment for the interview!  [facility] [address], [place], [place] [phone]   [facility]  [phone]   [facility]  [phone]   [facility]  [phone]    Behavioral Health Resources in the Community: Intensive Outpatient Programs Organization         Address  Phone  Notes  [facility] [address], [place], [place] [phone]   [facility] [address], [place], [place] [phone]   [facility] [address], [place], [place]  [phone]   [facility] [address],  [place], [place] [phone] or [phone]   Substance Abuse Resources Organization         Address  Phone  Notes  [facility] Drug Services  [phone]   Addiction  [facility]  [phone]   [facility]  [phone]   [facility]  [phone]   Residential & Outpatient Substance Abuse Program  [phone]   Psychological Services Organization         Address  Phone  Notes  [facility]  [phone]- [phone]   [facility]  [phone]- [phone]   [facility] [address], [place] [phone] or [phone]    Mobile Crisis Teams Organization         Address  Phone  Notes  [facility], Mobile Crisis Care Unit  [phone]   [facility]  [address] [place], [place] [phone]   Doristine LocksSharon DeEsch [address] [place] [phone]    Self-Help/Support Groups Organization         Address  Phone             Notes  [facility] [place] - variety of support groups  336- [phone] Call for more information  Narcotics Anonymous (NA), [facility] [address], [place] Choice  2 meetings at this location   Statisticianesidential Treatment Programs Organization         Address  Phone  Notes  [facility] [address],    [place]  [phone]   [facility]  [address], [place] [phone], [place], [place] [phone]   [facility] [address], [place] [phone] Admissions: 8am-3pm M-F  [facility] [address]    [place], [place] [phone]   [facility] [address], [place], [place] [phone]   [facility] [address]  [place], [place] [phone]   [facility] - Intensive Outpatient [address], [place], [place] [phone]   [facility] [address]  [place], [place] [phone] or [phone]   [facility] ([facility]) [address] [place], [place] [phone] Accepts Medicaid  [facility] [address]  [place]  [phone] Substance Abuse/Addiction Treatment   [facility] Resources Organization         Address  Phone  Notes  [facility]  [phone]) [phone]   Angie FavaJulie Brannon, PhD [address], [address] [place], [place]   [phone](336) [phone] or [phone](336) [phone]   [facility]   [address] [place], [place] [phone]) [phone]   [facility] [address], [place], [place] [phone]) [phone] Insurance/Medicaid/sponsorship through [facility]  [facility] Families [address]                                    [place], [place] [phone]) [phone] Therapy/tele-psych/case  [facility] [address]   [place], [place] [phone](336) [phone]    Dr. Lolly MustacheArfeen  [phone]   [facility]  [employer] [facility] 1) [address], [place] 2) [address], [place] 3)  [address], [place] [phone](336) [phone](336) [phone]  [phone](336) [phone]   [facility] Child Abuse Hotline [phone](336) 7864315755 or [phone](336) [phone] (After Hours)

## 2014-03-30 NOTE — ED Notes (Signed)
Pt sts he fell back while helping his daughter and landed on his bottom on the right side and lower back. sts also hit head but denies head pain or LOC. sts he fell because of his right leg giving out which in chronic.

## 2014-03-31 NOTE — ED Provider Notes (Signed)
Medical screening examination/treatment/procedure(s) were performed by non-physician practitioner and as supervising physician I was immediately available for consultation/collaboration.  Tabia Landowski L Cheyrl Buley, MD 03/31/14 0105 

## 2014-04-02 ENCOUNTER — Encounter (HOSPITAL_COMMUNITY): Payer: Self-pay | Admitting: Emergency Medicine

## 2014-04-02 ENCOUNTER — Emergency Department (INDEPENDENT_AMBULATORY_CARE_PROVIDER_SITE_OTHER)
Admission: EM | Admit: 2014-04-02 | Discharge: 2014-04-02 | Disposition: A | Discharge status: Home / Self Care | Payer: Medicare PPO | Source: Home / Self Care | Attending: Family Medicine | Admitting: Family Medicine

## 2014-04-02 DIAGNOSIS — M545 Low back pain, unspecified: Secondary | ICD-10-CM

## 2014-04-02 DIAGNOSIS — G8929 Other chronic pain: Secondary | ICD-10-CM

## 2014-04-02 MED ORDER — KETOROLAC TROMETHAMINE 30 MG/ML IJ SOLN
INTRAMUSCULAR | Status: AC
  Filled 2014-04-02: qty 1

## 2014-04-02 MED ORDER — KETOROLAC TROMETHAMINE 10 MG PO TABS: 10.0000 mg | ORAL_TABLET | Freq: Four times a day (QID) | ORAL | Status: DC | PRN

## 2014-04-02 MED ORDER — KETOROLAC TROMETHAMINE 30 MG/ML IJ SOLN
30.0000 mg | Freq: Once | INTRAMUSCULAR | Status: AC
  Administered 2014-04-02: 30 mg via INTRAMUSCULAR

## 2014-04-02 NOTE — ED Notes (Signed)
Pt    Reports  Low  Back  Pain         He  Was   Seen      Friday  At the  ucc   And  Er       Had  X  Rays       Done   At   That  Time          Pt  Reports          He  Is out  Of  His  Pain meds           And  At  This  Time        He  Has  No PCP          HE  AMBULATED  TO  ROOM  AND  IS  SITTIG  UPRIGHT ON  EXAM TABLE   SPEAKING IN  COMPLETE  SENTANCES

## 2014-04-02 NOTE — ED Provider Notes (Addendum)
CSN: [id number]     Arrival date & time 04/02/14  1101 History   None    Chief Complaint  Patient presents with  . Back Pain   (Consider location/radiation/quality/duration/timing/severity/associated sxs/prior Treatment) Patient is a 43 y.o. male presenting with back pain. The history is provided by the patient.  Back Pain Location:  Lumbar spine Radiates to:  Does not radiate Pain severity:  Moderate Onset quality:  Gradual Chronicity:  New Context comment:  [facility], now again with lumbar back pain., need something to take edge off.   Past Medical History  Diagnosis Date  . Stroke     brain aneurysm  . Hypertension   . Migraines   . Hypercholesteremia   . Asthma   . Chronic back pain   . Aneurysm    Past Surgical History  Procedure Laterality Date  . Back surgery     Family History  Problem Relation Age of Onset  . Diabetes Mother   . Hypertension Mother    History  Substance Use Topics  . Smoking status: Former Smoker    Types: Cigarettes  . Smokeless tobacco: Not on file  . Alcohol Use: No    Review of Systems  Gastrointestinal: Negative.   Genitourinary: Negative.   Musculoskeletal: Positive for back pain. Negative for gait problem.  Skin: Negative.     Allergies  Toradol; Tramadol; and Tylenol  Home Medications   Prior to Admission medications   Medication Sig Start Date End Date Taking? Authorizing Provider  aspirin 81 MG tablet Take 81 mg by mouth daily.    Historical Provider, MD  ketorolac (TORADOL) 10 MG tablet Take 1 tablet (10 mg total) by mouth every 6 (six) hours as needed. For pain 04/02/14   Linna HoffJames D Scarlett Portlock, MD  lisinopril-hydrochlorothiazide (PRINZIDE,ZESTORETIC) 20-12.5 MG per tablet Take 1 tablet by mouth daily.    Historical Provider, MD  methocarbamol (ROBAXIN) 500 MG tablet Take 1 tablet (500 mg total) by mouth 2 (two) times daily. 04/04/14   Hannah Muthersbaugh, PA-C  oxyCODONE (ROXICODONE) 5 MG  immediate release tablet Take 1 tablet (5 mg total) by mouth every 4 (four) hours as needed. Take 1-2 tablets every 4-6 hours as needed for pain control 03/30/14   Joni ReiningNicole Pisciotta, PA-C  oxyCODONE (ROXICODONE) 5 MG immediate release tablet Take 1 tablet (5 mg total) by mouth every 4 (four) hours as needed for severe pain. 04/04/14   Hannah Muthersbaugh, PA-C   There were no vitals taken for this visit. Physical Exam  Constitutional: He is oriented to person, place, and time.  Musculoskeletal: He exhibits tenderness.       Lumbar back: He exhibits decreased range of motion, tenderness and bony tenderness.       Back:  Neurological: He is alert and oriented to person, place, and time.    ED Course  Procedures (including critical care time) Labs Review Labs Reviewed - No data to display  Imaging Review No results found.   MDM   1. Chronic lower back pain        Linna HoffJames D Candler Ginsberg, MD 04/02/14 1118  Linna HoffJames D Carnell Casamento, MD 04/07/14 [phone]

## 2014-04-02 NOTE — Discharge Instructions (Signed)
See your orthopedist or family doctor as advised.

## 2014-04-04 ENCOUNTER — Emergency Department (HOSPITAL_COMMUNITY)
Admission: EM | Admit: 2014-04-04 | Discharge: 2014-04-04 | Disposition: A | Discharge status: Home / Self Care | Payer: Medicare PPO | Attending: Emergency Medicine | Admitting: Emergency Medicine

## 2014-04-04 ENCOUNTER — Encounter (HOSPITAL_COMMUNITY): Payer: Self-pay | Admitting: Emergency Medicine

## 2014-04-04 DIAGNOSIS — Z760 Encounter for issue of repeat prescription: Secondary | ICD-10-CM | POA: Insufficient documentation

## 2014-04-04 DIAGNOSIS — Z7982 Long term (current) use of aspirin: Secondary | ICD-10-CM | POA: Insufficient documentation

## 2014-04-04 DIAGNOSIS — I729 Aneurysm of unspecified site: Secondary | ICD-10-CM | POA: Insufficient documentation

## 2014-04-04 DIAGNOSIS — M545 Low back pain, unspecified: Secondary | ICD-10-CM

## 2014-04-04 DIAGNOSIS — G8929 Other chronic pain: Secondary | ICD-10-CM | POA: Diagnosis not present

## 2014-04-04 DIAGNOSIS — I10 Essential (primary) hypertension: Secondary | ICD-10-CM | POA: Diagnosis not present

## 2014-04-04 DIAGNOSIS — Z9889 Other specified postprocedural states: Secondary | ICD-10-CM | POA: Diagnosis not present

## 2014-04-04 DIAGNOSIS — J45909 Unspecified asthma, uncomplicated: Secondary | ICD-10-CM | POA: Diagnosis not present

## 2014-04-04 DIAGNOSIS — Z87891 Personal history of nicotine dependence: Secondary | ICD-10-CM | POA: Insufficient documentation

## 2014-04-04 DIAGNOSIS — Z8639 Personal history of other endocrine, nutritional and metabolic disease: Secondary | ICD-10-CM | POA: Diagnosis not present

## 2014-04-04 DIAGNOSIS — Z8673 Personal history of transient ischemic attack (TIA), and cerebral infarction without residual deficits: Secondary | ICD-10-CM | POA: Diagnosis not present

## 2014-04-04 DIAGNOSIS — M549 Dorsalgia, unspecified: Secondary | ICD-10-CM

## 2014-04-04 MED ORDER — OXYCODONE HCL 5 MG PO TABS
5.0000 mg | ORAL_TABLET | Freq: Once | ORAL | Status: AC
  Administered 2014-04-04: 5 mg via ORAL
  Filled 2014-04-04: qty 1

## 2014-04-04 MED ORDER — METHOCARBAMOL 500 MG PO TABS: 500.0000 mg | ORAL_TABLET | Freq: Two times a day (BID) | ORAL | Status: DC

## 2014-04-04 MED ORDER — METHOCARBAMOL 500 MG PO TABS
500.0000 mg | ORAL_TABLET | Freq: Once | ORAL | Status: AC
  Administered 2014-04-04: 500 mg via ORAL
  Filled 2014-04-04: qty 1

## 2014-04-04 MED ORDER — OXYCODONE HCL 5 MG PO TABS
5.0000 mg | ORAL_TABLET | ORAL | Status: DC | PRN
Start: 2014-04-04 — End: 2014-04-13

## 2014-04-04 NOTE — ED Notes (Addendum)
Pt can not take toradol because of Hx of  Aneurysm.

## 2014-04-04 NOTE — ED Notes (Signed)
Updated allergies to now include Toradol.

## 2014-04-04 NOTE — Discharge Instructions (Signed)
1. Medications: robaxin, roxicodone, usual home medications 2. Treatment: rest, drink plenty of fluids, gentle stretching as discussed, alternate ice and heat 3. Follow Up: Please followup with your primary doctor in 3 days for discussion of your diagnoses and further evaluation after today's visit; if you do not have a primary care doctor use the resource guide provided to find one;  Return to the ER for worsening back pain, difficulty walking, loss of bowel or bladder control or other concerning symptoms     Back Exercises Back exercises help treat and prevent back injuries. The goal of back exercises is to increase the strength of your abdominal and back muscles and the flexibility of your back. These exercises should be started when you no longer have back pain. Back exercises include:  Pelvic Tilt. Lie on your back with your knees bent. Tilt your pelvis until the lower part of your back is against the floor. Hold this position 5 to 10 sec and repeat 5 to 10 times.  Knee to Chest. Pull first 1 knee up against your chest and hold for 20 to 30 seconds, repeat this with the other knee, and then both knees. This may be done with the other leg straight or bent, whichever feels better.  Sit-Ups or Curl-Ups. Bend your knees 90 degrees. Start with tilting your pelvis, and do a partial, slow sit-up, lifting your trunk only 30 to 45 degrees off the floor. Take at least 2 to 3 seconds for each sit-up. Do not do sit-ups with your knees out straight. If partial sit-ups are difficult, simply do the above but with only tightening your abdominal muscles and holding it as directed.  Hip-Lift. Lie on your back with your knees flexed 90 degrees. Push down with your feet and shoulders as you raise your hips a couple inches off the floor; hold for 10 seconds, repeat 5 to 10 times.  Back arches. Lie on your stomach, propping yourself up on bent elbows. Slowly press on your hands, causing an arch in your low back.  Repeat 3 to 5 times. Any initial stiffness and discomfort should lessen with repetition over time.  Shoulder-Lifts. Lie face down with arms beside your body. Keep hips and torso pressed to floor as you slowly lift your head and shoulders off the floor. Do not overdo your exercises, especially in the beginning. Exercises may cause you some mild back discomfort which lasts for a few minutes; however, if the pain is more severe, or lasts for more than 15 minutes, do not continue exercises until you see your caregiver. Improvement with exercise therapy for back problems is slow.  See your caregivers for assistance with developing a proper back exercise program. Document Released: 07/08/2004 Document Revised: 08/23/2011 Document Reviewed: 04/01/2011 [facility] Patient Information 2015 [employer]. This information is not intended to replace advice given to you by your health care provider. Make sure you discuss any questions you have with your health care provider.

## 2014-04-04 NOTE — ED Provider Notes (Signed)
CSN: [id number]     Arrival date & time 04/04/14  1825 History  This chart was scribed for non-physician practitioner Dierdre Forth, PA-C working with Audree Camel, MD by Murriel Hopper, ED Scribe. This patient was seen in room TR10C/TR10C and the patient's care was started at 7:50 PM.    Chief Complaint  Patient presents with  . Medication Refill  . Back Pain     (Consider location/radiation/quality/duration/timing/severity/associated sxs/prior Treatment) The history is provided by the patient and medical records. No language interpreter was used.    HPI Comments: John Duffy is a 43 y.o. male, with history of stroke who presents to the Emergency Department complaining of a fall that occurred 6 days ago while helping his disabled daughter up the stairs. Pt notes that he has bilateral lower back pain that has been constant since the fall. Pt was seen at Urgent care 5 days ago and was sent to ED to receive x-rays. Record review shows no acute abnormality or fractures of the x-rays at that visit. Patient was discharged home with 5 mg prescription of oxycodone, and notes that it improved pain. Pt followed up with urgent care 2 days ago, and was prescribed Toradol which he reports that he cannot take because of a prior aneurysm and hemorrhagic stroke.  Patient reports that he is chronically weak on his right side secondary to this.. Pt returned to the ED today because he ran out of pain medication.  Pt denies tingling or weakness in his legs, loss of bowel or bladder control, anesthesia. Pt has had two back surgeries in 2000 and 2011.     Past Medical History  Diagnosis Date  . Stroke     brain aneurysm  . Hypertension   . Migraines   . Hypercholesteremia   . Asthma   . Chronic back pain   . Aneurysm    Past Surgical History  Procedure Laterality Date  . Back surgery     Family History  Problem Relation Age of Onset  . Diabetes Mother   . Hypertension Mother    History   Substance Use Topics  . Smoking status: Former Smoker    Types: Cigarettes  . Smokeless tobacco: Not on file  . Alcohol Use: No    Review of Systems  Constitutional: Negative for fever and fatigue.  Respiratory: Negative for chest tightness and shortness of breath.   Cardiovascular: Negative for chest pain.  Gastrointestinal: Negative for nausea, vomiting, abdominal pain and diarrhea.  Genitourinary: Negative for dysuria, urgency, frequency and hematuria.  Musculoskeletal: Positive for back pain (acute on chronic). Negative for gait problem, joint swelling, neck pain and neck stiffness.  Skin: Negative for rash.  Neurological: Negative for weakness, light-headedness, numbness and headaches.  All other systems reviewed and are negative.     Allergies  Toradol; Tramadol; and Tylenol  Home Medications   Prior to Admission medications   Medication Sig Start Date End Date Taking? Authorizing Provider  aspirin 81 MG tablet Take 81 mg by mouth daily.    Historical Provider, MD  ketorolac (TORADOL) 10 MG tablet Take 1 tablet (10 mg total) by mouth every 6 (six) hours as needed. For pain 04/02/14   Linna Hoff, MD  lisinopril-hydrochlorothiazide (PRINZIDE,ZESTORETIC) 20-12.5 MG per tablet Take 1 tablet by mouth daily.    Historical Provider, MD  methocarbamol (ROBAXIN) 500 MG tablet Take 1 tablet (500 mg total) by mouth 2 (two) times daily. 04/04/14   Ashley Montminy, PA-C  oxyCODONE (ROXICODONE)  5 MG immediate release tablet Take 1 tablet (5 mg total) by mouth every 4 (four) hours as needed. Take 1-2 tablets every 4-6 hours as needed for pain control 03/30/14   Joni ReiningNicole Pisciotta, PA-C  oxyCODONE (ROXICODONE) 5 MG immediate release tablet Take 1 tablet (5 mg total) by mouth every 4 (four) hours as needed for severe pain. 04/04/14   Tierney Behl, PA-C   BP 158/108  Pulse 75  Temp(Src) 98.3 F (36.8 C) (Oral)  Resp 18  Ht 5\' 9"  (1.753 m)  Wt 175 lb (79.379 kg)  BMI 25.83  kg/m2  SpO2 99% Physical Exam  Nursing note and vitals reviewed. Constitutional: He appears well-developed and well-nourished. No distress.  HENT:  Head: Normocephalic and atraumatic.  Mouth/Throat: Oropharynx is clear and moist. No oropharyngeal exudate.  Eyes: Conjunctivae are normal.  Neck: Normal range of motion. Neck supple.  Full ROM without pain  Cardiovascular: Normal rate, regular rhythm, normal heart sounds and intact distal pulses.   No murmur heard. Pulmonary/Chest: Effort normal and breath sounds normal. No respiratory distress. He has no wheezes.  Abdominal: Soft. He exhibits no distension. There is no tenderness.  Musculoskeletal: He exhibits tenderness.  Full range of motion of the T-spine and L-spine No tenderness to palpation of the spinous processes of the T-spine or L-spine Mild tenderness to palpation of the paraspinous muscles of the L-spine Midline surgical incision, well-healed  Lymphadenopathy:    He has no cervical adenopathy.  Neurological: He is alert. He has normal reflexes. He exhibits normal muscle tone. Coordination normal.  Reflex Scores:      Bicep reflexes are 2+ on the right side and 2+ on the left side.      Brachioradialis reflexes are 2+ on the right side and 2+ on the left side.      Patellar reflexes are 2+ on the right side and 2+ on the left side.      Achilles reflexes are 2+ on the right side and 2+ on the left side. Speech is clear and goal oriented, follows commands Normal 5/5 strength in bilateral upper upper and left lower extremities strong and equal grip strength, and dorsiflexion and plantar flexion of the left foot Decreased 4/5 strength in right lower extremiy including dorsiflexion and plantar flexion Sensation normal to light and sharp touch Moves extremities without ataxia, coordination intact Antalgic gait Normal balance No Clonus  Skin: Skin is warm and dry. No rash noted. He is not diaphoretic. No erythema.  Psychiatric:  He has a normal mood and affect. His behavior is normal.    ED Course  Procedures (including critical care time)  DIAGNOSTIC STUDIES: Oxygen Saturation is 99% on RA, normal by my interpretation.    COORDINATION OF CARE: 7:54 PM Discussed treatment plan with pt at bedside and pt agreed to plan.   Labs Review Labs Reviewed - No data to display  Imaging Review No results found.   EKG Interpretation None       Medications  methocarbamol (ROBAXIN) tablet 500 mg (500 mg Oral Given 04/04/14 1959)  oxyCODONE (Oxy IR/ROXICODONE) immediate release tablet 5 mg (5 mg Oral Given 04/04/14 1959)    MDM   Final diagnoses:  Bilateral low back pain without sciatica  Chronic back pain   John Duffy resents with acute exacerbation of chronic back pain.  Patient seen and evaluated several times for this with negative x-rays. He reports his primary complaint today is that he needs a refill on his pain medication. Patient  has been seen by case management who is going to assist with establishment of a primary care for further followup options.  Baseline neurologic exam with some weakness on the right leg, unchanged..  Patient can walk but states is painful.  No loss of bowel or bladder control.  No concern for cauda equina.  No fever, night sweats, weight loss, h/o cancer, IVDU.  RICE protocol and pain medicine indicated and discussed with patient.  I have personally reviewed patient's vitals, nursing note and any pertinent labs or imaging.  I performed an focused physical exam; undressed when appropriate .    It has been determined that no acute conditions requiring further emergency intervention are present at this time. The patient/guardian have been advised of the diagnosis and plan. I reviewed any labs and imaging including any potential incidental findings. We have discussed signs and symptoms that warrant return to the ED and they are listed in the discharge instructions.    Vital signs are  stable at discharge.   BP 158/108  Pulse 75  Temp(Src) 98.3 F (36.8 C) (Oral)  Resp 18  Ht 5\' 9"  (1.753 m)  Wt 175 lb (79.379 kg)  BMI 25.83 kg/m2  SpO2 99%  I personally performed the services described in this documentation, which was scribed in my presence. The recorded information has been reviewed and is accurate.   Dahlia ClientHannah Alphus Zeck, PA-C 04/04/14 2025

## 2014-04-04 NOTE — Progress Notes (Signed)
ED CM noted patient have had 11 EDd visits in the past 6 months. Patient presented to [facility] ED for medication refill  , and  Back pain.  Reviewed EMR, and confirmed with patient.[employer] is  [employer]/medicare.  Met with patient at bedside. Patient states, he does not have a PCP here in [place], he moved here from [place]. He states that he has an appt with his PCP in [place] next Thursday 10/29 because he has not been able to establish care with a PCP. Offered to assist finding a PCP, he is agreeable. Provided patient with a referral to [facility].  ED CM will place the referral tomorrow, and have someone contact patient with appt information. No further ED CM needs at this time.

## 2014-04-04 NOTE — ED Notes (Signed)
Declined W/C at D/C and was escorted to lobby by RN. 

## 2014-04-04 NOTE — ED Notes (Signed)
Fell last Friday; treated here for chronic back pain r/t fall; prescribed Toradol. Was told not to get toradol b/c he has a cerebral aneurysm. So, he is here for a different prescription.

## 2014-04-05 NOTE — ED Provider Notes (Signed)
Medical screening examination/treatment/procedure(s) were performed by non-physician practitioner and as supervising physician I was immediately available for consultation/collaboration.   EKG Interpretation None        Jenene Kauffmann T Yetunde Leis, MD 04/05/14 0039 

## 2014-04-07 ENCOUNTER — Encounter (HOSPITAL_COMMUNITY): Payer: Self-pay | Admitting: Emergency Medicine

## 2014-04-07 ENCOUNTER — Emergency Department (INDEPENDENT_AMBULATORY_CARE_PROVIDER_SITE_OTHER)
Admission: EM | Admit: 2014-04-07 | Discharge: 2014-04-07 | Disposition: A | Discharge status: Home / Self Care | Payer: Medicare PPO | Source: Home / Self Care | Attending: Family Medicine | Admitting: Family Medicine

## 2014-04-07 DIAGNOSIS — G8929 Other chronic pain: Secondary | ICD-10-CM

## 2014-04-07 DIAGNOSIS — M549 Dorsalgia, unspecified: Secondary | ICD-10-CM

## 2014-04-07 MED ORDER — LIDOCAINE 5 % EX PTCH
1.0000 | MEDICATED_PATCH | CUTANEOUS | Status: AC
Start: 2014-04-07 — End: ?

## 2014-04-07 NOTE — Discharge Instructions (Signed)

## 2014-04-07 NOTE — ED Provider Notes (Signed)
CSN: [id number]     Arrival date & time 04/07/14  1042 History   First MD Initiated Contact with Patient 04/07/14 1133     Chief Complaint  Patient presents with  . Medication Refill   (Consider location/radiation/quality/duration/timing/severity/associated sxs/prior Treatment) HPI        43 year old male presents complaining of chronic back pain, requesting a refill of his pain medicine. He takes chronic narcotic pain medicines for back pain. He also says that he has a history of back and leg pain resulting from an aneurysm and from multiple back surgeries, and chronic right leg weakness since having an aneurysm years ago. He had a fall a few weeks ago that has caused the back pain flareup. He has been seen in the emergency department multiple times. He has been told to find a primary care doctor but he says he has only been in [place] for 6 months and has not had time to find one. No loss of bowel or bladder control.  Past Medical History  Diagnosis Date  . Stroke     brain aneurysm  . Hypertension   . Migraines   . Hypercholesteremia   . Asthma   . Chronic back pain   . Aneurysm    Past Surgical History  Procedure Laterality Date  . Back surgery     Family History  Problem Relation Age of Onset  . Diabetes Mother   . Hypertension Mother    History  Substance Use Topics  . Smoking status: Former Smoker    Types: Cigarettes  . Smokeless tobacco: Not on file  . Alcohol Use: No    Review of Systems  Musculoskeletal: Positive for back pain.  Neurological: Negative for weakness and numbness.  All other systems reviewed and are negative.   Allergies  Toradol; Tramadol; and Tylenol  Home Medications   Prior to Admission medications   Medication Sig Start Date End Date Taking? Authorizing Provider  aspirin 81 MG tablet Take 81 mg by mouth daily.    Historical Provider, MD  ketorolac (TORADOL) 10 MG tablet Take 1 tablet (10 mg total) by mouth every 6 (six) hours as  needed. For pain 04/02/14   Linna HoffJames D Kindl, MD  lidocaine (LIDODERM) 5 % Place 1 patch onto the skin daily. Remove & Discard patch within 12 hours or as directed by MD 04/07/14   Graylon GoodZachary H Sharrell Krawiec, PA-C  lisinopril-hydrochlorothiazide (PRINZIDE,ZESTORETIC) 20-12.5 MG per tablet Take 1 tablet by mouth daily.    Historical Provider, MD  methocarbamol (ROBAXIN) 500 MG tablet Take 1 tablet (500 mg total) by mouth 2 (two) times daily. 04/04/14   Hannah Muthersbaugh, PA-C  oxyCODONE (ROXICODONE) 5 MG immediate release tablet Take 1 tablet (5 mg total) by mouth every 4 (four) hours as needed. Take 1-2 tablets every 4-6 hours as needed for pain control 03/30/14   Joni ReiningNicole Pisciotta, PA-C  oxyCODONE (ROXICODONE) 5 MG immediate release tablet Take 1 tablet (5 mg total) by mouth every 4 (four) hours as needed for severe pain. 04/04/14   Hannah Muthersbaugh, PA-C   BP 149/100  Pulse 99  Temp(Src) 98.2 F (36.8 C) (Oral)  Resp 16 Physical Exam  Nursing note and vitals reviewed. Constitutional: He is oriented to person, place, and time. He appears well-developed and well-nourished. No distress.  HENT:  Head: Normocephalic.  Pulmonary/Chest: Effort normal. No respiratory distress.  Neurological: He is alert and oriented to person, place, and time. He has normal reflexes. No sensory deficit. He exhibits normal muscle  tone. Gait (limping on the right, chronic ) abnormal. Coordination normal.  Right leg weakness, chronic   Skin: Skin is warm and dry. No rash noted. He is not diaphoretic.  Psychiatric: He has a normal mood and affect. Judgment normal.    ED Course  Procedures (including critical care time) Labs Review Labs Reviewed - No data to display  Imaging Review No results found.   MDM   1. Chronic back pain    Pt says he has only been here for 6 months and has not had time to get PCP but review of controlled substances database shows he has been filling narcotics in [place] for 3.5 years.   Also grossly inappropriate amount of narcotics filled by this patient.  Will not refill chronic pain meds.  Will Rx lidoderm patches.  F/U with PCP     Graylon GoodZachary H Jameek Bruntz, PA-C 04/07/14 1155

## 2014-04-07 NOTE — ED Provider Notes (Signed)
Medical screening examination/treatment/procedure(s) were performed by resident physician or non-physician practitioner and as supervising physician I was immediately available for consultation/collaboration.   Barkley BrunsKINDL,Janson DOUGLAS MD.   Linna HoffJames D Willean Schurman, MD 04/07/14 [phone]

## 2014-04-07 NOTE — ED Notes (Signed)
Requesting pain medication refill

## 2014-04-13 ENCOUNTER — Encounter (HOSPITAL_COMMUNITY): Payer: Self-pay | Admitting: Emergency Medicine

## 2014-04-13 ENCOUNTER — Emergency Department (HOSPITAL_COMMUNITY)
Admission: EM | Admit: 2014-04-13 | Discharge: 2014-04-13 | Disposition: A | Discharge status: Home / Self Care | Payer: Medicare PPO | Attending: Emergency Medicine | Admitting: Emergency Medicine

## 2014-04-13 DIAGNOSIS — G43909 Migraine, unspecified, not intractable, without status migrainosus: Secondary | ICD-10-CM | POA: Insufficient documentation

## 2014-04-13 DIAGNOSIS — R200 Anesthesia of skin: Secondary | ICD-10-CM | POA: Diagnosis not present

## 2014-04-13 DIAGNOSIS — M6281 Muscle weakness (generalized): Secondary | ICD-10-CM | POA: Diagnosis not present

## 2014-04-13 DIAGNOSIS — Z8679 Personal history of other diseases of the circulatory system: Secondary | ICD-10-CM | POA: Diagnosis not present

## 2014-04-13 DIAGNOSIS — G8929 Other chronic pain: Secondary | ICD-10-CM | POA: Insufficient documentation

## 2014-04-13 DIAGNOSIS — M549 Dorsalgia, unspecified: Secondary | ICD-10-CM

## 2014-04-13 DIAGNOSIS — I10 Essential (primary) hypertension: Secondary | ICD-10-CM | POA: Diagnosis not present

## 2014-04-13 DIAGNOSIS — Z87828 Personal history of other (healed) physical injury and trauma: Secondary | ICD-10-CM | POA: Diagnosis not present

## 2014-04-13 DIAGNOSIS — Z8673 Personal history of transient ischemic attack (TIA), and cerebral infarction without residual deficits: Secondary | ICD-10-CM | POA: Diagnosis not present

## 2014-04-13 DIAGNOSIS — Z8639 Personal history of other endocrine, nutritional and metabolic disease: Secondary | ICD-10-CM | POA: Diagnosis not present

## 2014-04-13 DIAGNOSIS — Z9889 Other specified postprocedural states: Secondary | ICD-10-CM | POA: Diagnosis not present

## 2014-04-13 DIAGNOSIS — Z87891 Personal history of nicotine dependence: Secondary | ICD-10-CM | POA: Insufficient documentation

## 2014-04-13 DIAGNOSIS — M545 Low back pain: Secondary | ICD-10-CM | POA: Insufficient documentation

## 2014-04-13 DIAGNOSIS — J45909 Unspecified asthma, uncomplicated: Secondary | ICD-10-CM | POA: Insufficient documentation

## 2014-04-13 MED ORDER — OXYCODONE HCL 5 MG PO TABS
10.0000 mg | ORAL_TABLET | Freq: Once | ORAL | Status: AC
  Administered 2014-04-13: 10 mg via ORAL
  Filled 2014-04-13: qty 2

## 2014-04-13 MED ORDER — OXYCODONE HCL 5 MG PO TABS: 5.0000 mg | ORAL_TABLET | Freq: Four times a day (QID) | ORAL | Status: AC | PRN

## 2014-04-13 NOTE — Discharge Instructions (Signed)
Chronic Pain Chronic pain can be defined as pain that is off and on and lasts for 3-6 months or longer. Many things cause chronic pain, which can make it difficult to make a diagnosis. There are many treatment options available for chronic pain. However, finding a treatment that works well for you may require trying various approaches until the right one is found. Many people benefit from a combination of two or more types of treatment to control their pain. SYMPTOMS  Chronic pain can occur anywhere in the body and can range from mild to very severe. Some types of chronic pain include:  Headache.  Low back pain.  Cancer pain.  Arthritis pain.  Neurogenic pain. This is pain resulting from damage to nerves. People with chronic pain may also have other symptoms such as:  Depression.  Anger.  Insomnia.  Anxiety. DIAGNOSIS  Your health care provider will help diagnose your condition over time. In many cases, the initial focus will be on excluding possible conditions that could be causing the pain. Depending on your symptoms, your health care provider may order tests to diagnose your condition. Some of these tests may include:   Blood tests.   CT scan.   MRI.   X-rays.   Ultrasounds.   Nerve conduction studies.  You may need to see a specialist.  TREATMENT  Finding treatment that works well may take time. You may be referred to a pain specialist. He or she may prescribe medicine or therapies, such as:   Mindful meditation or yoga.  Shots (injections) of numbing or pain-relieving medicines into the spine or area of pain.  Local electrical stimulation.  Acupuncture.   Massage therapy.   Aroma, color, light, or sound therapy.   Biofeedback.   Working with a physical therapist to keep from getting stiff.   Regular, gentle exercise.   Cognitive or behavioral therapy.   Group support.  Sometimes, surgery may be recommended.  HOME CARE INSTRUCTIONS    Take all medicines as directed by your health care provider.   Lessen stress in your life by relaxing and doing things such as listening to calming music.   Exercise or be active as directed by your health care provider.   Eat a healthy diet and include things such as vegetables, fruits, fish, and lean meats in your diet.   Keep all follow-up appointments with your health care provider.   Attend a support group with others suffering from chronic pain. SEEK MEDICAL CARE IF:   Your pain gets worse.   You develop a new pain that was not there before.   You cannot tolerate medicines given to you by your health care provider.   You have new symptoms since your last visit with your health care provider.  SEEK IMMEDIATE MEDICAL CARE IF:   You feel weak.   You have decreased sensation or numbness.   You lose control of bowel or bladder function.   Your pain suddenly gets much worse.   You develop shaking.  You develop chills.  You develop confusion.  You develop chest pain.  You develop shortness of breath.  MAKE SURE YOU:  Understand these instructions.  Will watch your condition.  Will get help right away if you are not doing well or get worse. Document Released: 02/20/2002 Document Revised: 01/31/2013 Document Reviewed: 11/24/2012 [facility] Patient Information 2015 [place], [place]. This information is not intended to replace advice given to you by your health care provider. Make sure you discuss any  questions you have with your health care provider.    Back Pain, Adult Low back pain is very common. About 1 in 5 people have back pain.The cause of low back pain is rarely dangerous. The pain often gets better over time.About half of people with a sudden onset of back pain feel better in just 2 weeks. About 8 in 10 people feel better by 6 weeks.  CAUSES Some common causes of back pain include:  Strain of the muscles or ligaments supporting the  spine.  Wear and tear (degeneration) of the spinal discs.  Arthritis.  Direct injury to the back. DIAGNOSIS Most of the time, the direct cause of low back pain is not known.However, back pain can be treated effectively even when the exact cause of the pain is unknown.Answering your caregiver's questions about your overall health and symptoms is one of the most accurate ways to make sure the cause of your pain is not dangerous. If your caregiver needs more information, he or she may order lab work or imaging tests (X-rays or MRIs).However, even if imaging tests show changes in your back, this usually does not require surgery. HOME CARE INSTRUCTIONS For many people, back pain returns.Since low back pain is rarely dangerous, it is often a condition that people can learn to [facility] their own.   Remain active. It is stressful on the back to sit or stand in one place. Do not sit, drive, or stand in one place for more than 30 minutes at a time. Take short walks on level surfaces as soon as pain allows.Try to increase the length of time you walk each day.  Do not stay in bed.Resting more than 1 or 2 days can delay your recovery.  Do not avoid exercise or work.Your body is made to move.It is not dangerous to be active, even though your back may hurt.Your back will likely heal faster if you return to being active before your pain is gone.  Pay attention to your body when you bend and lift. Many people have less discomfortwhen lifting if they bend their knees, keep the load close to their bodies,and avoid twisting. Often, the most comfortable positions are those that put less stress on your recovering back.  Find a comfortable position to sleep. Use a firm mattress and lie on your side with your knees slightly bent. If you lie on your back, put a pillow under your knees.  Only take over-the-counter or prescription medicines as directed by your caregiver. Over-the-counter medicines to reduce  pain and inflammation are often the most helpful.Your caregiver may prescribe muscle relaxant drugs.These medicines help dull your pain so you can more quickly return to your normal activities and healthy exercise.  Put ice on the injured area.  Put ice in a plastic bag.  Place a towel between your skin and the bag.  Leave the ice on for 15-20 minutes, 03-04 times a day for the first 2 to 3 days. After that, ice and heat may be alternated to reduce pain and spasms.  Ask your caregiver about trying back exercises and gentle massage. This may be of some benefit.  Avoid feeling anxious or stressed.Stress increases muscle tension and can worsen back pain.It is important to recognize when you are anxious or stressed and learn ways to manage it.Exercise is a great option. SEEK MEDICAL CARE IF:  You have pain that is not relieved with rest or medicine.  You have pain that does not improve in 1 week.  You have new symptoms.  You are generally not feeling well. SEEK IMMEDIATE MEDICAL CARE IF:   You have pain that radiates from your back into your legs.  You develop new bowel or bladder control problems.  You have unusual weakness or numbness in your arms or legs.  You develop nausea or vomiting.  You develop abdominal pain.  You feel faint. Document Released: 05/31/2005 Document Revised: 11/30/2011 Document Reviewed: 10/02/2013 [facility] Patient Information 2015 [place], [place]. This information is not intended to replace advice given to you by your health care provider. Make sure you discuss any questions you have with your health care provider.     Emergency Department Resource Guide 1) Find a Doctor and Pay Out of Pocket Although you won't have to find out who is covered by your insurance plan, it is a good idea to ask around and get recommendations. You will then need to call the office and see if the doctor you have chosen will accept you as a new patient and what types of  options they offer for patients who are self-pay. Some doctors offer discounts or will set up payment plans for their patients who do not have insurance, but you will need to ask so you aren't surprised when you get to your appointment.  2) Contact Your Local Health Department Not all health departments have doctors that can see patients for sick visits, but many do, so it is worth a call to see if yours does. If you don't know where your local health department is, you can check in your phone book. The CDC also has a tool to help you locate your state's health department, and many state websites also have listings of all of their local health departments.  3) Find a Walk-in Clinic If your illness is not likely to be very severe or complicated, you may want to try a walk in clinic. These are popping up all over the country in pharmacies, drugstores, and shopping centers. They're usually staffed by nurse practitioners or physician assistants that have been trained to treat common illnesses and complaints. They're usually fairly quick and inexpensive. However, if you have serious medical issues or chronic medical problems, these are probably not your best option.  No Primary Care Doctor: - Call Health Connect at  [phone] - they can help you locate a primary care doctor that  accepts your insurance, provides certain services, etc. - Physician Referral Service- [phone]  Chronic Pain Problems: Organization         Address  Phone   Notes  [facility]  [phone] Patients need to be referred by their primary care doctor.   Medication Assistance: Organization         Address  Phone   Notes  [facility] Medication [facility] [place] [facility] [address] [place], [place] [address] [phone] --Must be a resident of [facility] -- Must have NO insurance coverage whatsoever (no Medicaid/ Medicare, etc.) -- The pt. MUST have a primary care doctor that directs  their care regularly and follows them in the community   MedAssist  [phone]   [employer]  [phone]    Agencies that provide inexpensive medical care: Organization         Address  Phone   Notes  [facility]  [phone]   [facility]    [phone]   [facility] [address] [place],  [place] [phone]   [facility] [address], [place] [phone]   Planned Parenthood    [phone]   [facility]    [phone]) [phone]   [facility]  [address], [place] Phone:  [phone](336) [phone], Fax:  [phone] Hours of Operation:  9 am - 6 pm, M-F.  Also accepts Medicaid/Medicare and self-pay.  [facility]  [address], [place] Phone: [phone]) [phone], Fax: [phone]) [phone]. Hours of Operation:  8:30 am - 5:30 pm, M-F.  Also accepts Medicaid and self-pay.  [facility] [place] [address], [place] Phone: [phone](336) [phone]   [facility] [address], [place], [place] [phone], Ext. [phone] Mondays & Thursdays: 7-9 AM.  First 15 patients are seen on a first come, first serve basis.    Medicaid-accepting [facility] Providers:  Organization         Address  Phone   Notes  [facility] [address], [place] [phone]) [phone] Also accepts self-pay patients.  [facility] [address], [place]  [phone]) [phone]   [facility] [address], [place] [phone]) [phone]   [facility] [address] [place] [address], [place] [phone]) [phone]   Renaye RakersVeita Bland [address], [place]   [phone]) [phone] Only accepts [place] Access [place] patients after they have their name applied to their card.   Self-Pay (no insurance) in [facility] [place]:  Organization          Address  Phone   Notes  Sickle Cell Patients, [facility] [address], [place] [phone]) [phone]   [facility] [address], [place] [phone]) [phone]   [facility] [place]  [address], [place] [phone]) [phone]   [facility]/Dr. Osei-Bonsu  [address] [place] [address], [place] or [phone], [place] [phone]) [phone] Phone number for both [place] and [place] locations is the same.  [facility] [address], [place] [phone]) [phone]   [facility] [address] [place] [address], [place] or [address] [phone](336) [phone]) [phone]   [facility] [address], [place] [phone](336) [phone], phone; [phone]) [phone], fax Sees patients 1st and 3rd Saturday of every month.  Must not qualify for public or private insurance (i.e. Medicaid, Medicare, [place] Health Choice, Veterans' Benefits)  Household income should be no more than 200% of the poverty level The clinic cannot treat you if you are pregnant or think you are pregnant  Sexually transmitted diseases are not treated at the clinic.    Dental Care: Organization         Address  Phone  Notes  [facility] Department of [facility] [address], [place] [phone]) [phone] Accepts children up to age 45 who are enrolled in [place] or [place] Health Choice; pregnant women with a Medicaid card; and children who have applied for Medicaid or [place] Health Choice, but were declined, whose parents can pay a reduced fee at time of service.  [facility] Department of [facility] [place]  [address], [place] [phone]) [phone] Accepts children up to age 45 who are enrolled in [place] or [place] Health Choice; pregnant women  with a Medicaid card; and children who have applied for Medicaid or [place] Health Choice, but were declined, whose parents can pay a reduced fee at time of  service.  [facility]  [address] [place], [place] [phone] Patients are seen by appointment only. Walk-ins are not accepted. [facility] will see patients 32 years of age and older. Monday - Tuesday (8am-5pm) Most Wednesdays (8:30-5pm) $30 per visit, cash only  [facility] Adult Dental Access PRO[facility]  [address], [facility] [phone] Patients are seen by appointment only. Walk-ins are not accepted. [facility] will see patients 60 years of age and older. One Wednesday Evening (Monthly: Volunteer Based).  $30 per visit, cash only  [employer]  [phone] for adults; Children under age 60, call Graduate Pediatric Dentistry at [phone]. Children aged 42-14, please call [phone] to request a pediatric application.  Dental services are provided in all areas of dental care including fillings, crowns and bridges, complete and partial dentures, implants, gum treatment, root canals, and extractions. Preventive care is also provided. Treatment is provided to both adults and children. Patients are selected via a lottery and there is often a waiting list.   [facility] [address], [place]  [phone] [email or web address]   [facility] [address] [place] [address] [place], [place] [phone], Ext. [phone] Second and Fourth Thursday of each month, opens at 6:30 AM; Clinic ends at 9 AM.  Patients are seen on a first-come first-served basis, and a limited number are seen during each clinic.   [facility]  [address] [place], [place] [phone]   Eligibility Requirements You must have lived in [place], [place], or [place] counties for at least the last three months.   You cannot be eligible for state or federal sponsored [place], including [employer], [place], or [employer].   You generally cannot be eligible for healthcare insurance through your employer.     How to apply: Eligibility screenings are held every Tuesday and Wednesday afternoon from 1:00 pm until 4:00 pm. You do not need an appointment for the interview!  [facility] [address] [place], [place] [phone]   [facility] Health Department  [phone]   [facility] Health Department  [phone]   [facility]  [phone]    Behavioral Health Resources in the Community: Intensive Outpatient Programs Organization         Address  Phone  Notes  [facility] [address], [place], [place] [phone]   [facility] Outpatient [address], [place], [place] [phone]   [facility] [address] [place], [place]  [phone]   [facility] [address],  [place], [place] [phone] or [phone]   Substance Abuse Resources Organization         Address  Phone  Notes  [facility] Drug [facility]  [phone]   [facility]  [phone]   [facility] [place]  [phone]   Floydene Flock  [phone]   Residential & Outpatient Substance Abuse Program  [phone]   Psychological Services Organization         Address  Phone  Notes  [facility]  [phone]   [facility]  [phone]   [facility] [address], [place] [phone] or [phone]    Mobile Crisis Teams  Organization         Address  Phone  Notes  [facility], Mobile Crisis Care Unit  [phone]   [facility]  [address] [place], [place] [phone]   [facility] De[facility]sch [address] [place] [phone]    Self-Help/Support Groups Organization         Address  Phone             Notes  [facility] - variety of support groups  336- [phone] Call for more information  Narcotics Anonymous (NA), [facility] [address], [place]  2 meetings at this location   Statisticianesidential Treatment Programs Organization         Address  Phone  Notes  [facility] [address],    [place]  [phone]   [facility]  [address], [place] [phone], [place], [place] [phone]   [facility] [address], [place] [phone] Admissions: 8am-3pm M-F  [facility] [address]    [place], [place] [phone]   [facility] [address], [place], [place] [phone]   [facility] [address]  [place], [place] [phone]   [facility] - Intensive Outpatient [address], [place], [place] [phone]   [facility] [address]  [place], [place] [phone] or [phone]   [facility] ([facility]) [address] [place], [place] [phone] Accepts Medicaid  [facility] [address]  [place] [phone] Substance Abuse/Addiction Treatment   [facility] Behavioral Health Resources Organization         Address  Phone  Notes  [facility]  [phone]) [phone]   Angie FavaJulie Brannon, PhD [address], [address] [place], [place]   [phone]) [phone] or [phone](336) [phone]   [facility]   [address] [place], [place] [phone](336) [phone]   [facility] [address], [place], [place] [phone](336) [phone] Insurance/Medicaid/sponsorship through [facility]  [facility] Families [address]                                    [place], [place] [phone](336) [phone] Therapy/tele-psych/case  [facility] [address].   [place], [place] [phone]) [phone]    Dr. Lolly MustacheArfeen  [phone](336) [phone]   [facility]  [employer] [facility] 1) [address], [place] 2) [address], [place] 3)  [address], [place] [phone](336) [phone](336) [phone]   [facility] Child Abuse  Hotline [phone](336) [phone] or [phone](336) [phone] (After Hours)

## 2014-04-13 NOTE — ED Provider Notes (Signed)
CSN: [id number]     Arrival date & time 04/13/14  0416 History   First MD Initiated Contact with Patient 04/13/14 0554     Chief Complaint  Patient presents with  . Back Pain     (Consider location/radiation/quality/duration/timing/severity/associated sxs/prior Treatment) HPI  43 year old male with chronic back pain presents with acute on chronic pain. He states his pain has been worsening since a fall 2 weeks ago. He was seen in this ER for that fall and had a negative lumbar x-ray. He states he is set up for a repeat MRI given these had 2 back surgeries but this is not until 2 weeks from now. Patient states he does not have a PCP and has to keep coming to the ER for pain medicines. Briefly went to urgent care was given a Lidoderm patch which she states is not working. Patient has chronic right lower extremity weakness from her prior stroke from aneurysm states his leg is not weaker than normal. Chronic numbness is not changed either. Left leg is normal. No saddle anesthesia, urinary or bowel incontinence, fevers, or new injury.   Past Medical History  Diagnosis Date  . Stroke     brain aneurysm  . Hypertension   . Migraines   . Hypercholesteremia   . Asthma   . Chronic back pain   . Aneurysm    Past Surgical History  Procedure Laterality Date  . Back surgery     Family History  Problem Relation Age of Onset  . Diabetes Mother   . Hypertension Mother    History  Substance Use Topics  . Smoking status: Former Smoker    Types: Cigarettes  . Smokeless tobacco: Not on file  . Alcohol Use: No    Review of Systems  Constitutional: Negative for fever.  Genitourinary:       No bowel or bladder incontinence  Musculoskeletal: Positive for back pain.  Neurological: Positive for weakness (chronic RLE weakness, not worse) and numbness (chronic right leg numbness).  All other systems reviewed and are negative.     Allergies  Tramadol and Tylenol  Home Medications   Prior  to Admission medications   Medication Sig Start Date End Date Taking? Authorizing Provider  lidocaine (LIDODERM) 5 % Place 1 patch onto the skin daily. Remove & Discard patch within 12 hours or as directed by MD 04/07/14  Yes Graylon GoodZachary H Baker, PA-C  lisinopril-hydrochlorothiazide (PRINZIDE,ZESTORETIC) 20-12.5 MG per tablet Take 1 tablet by mouth daily.   Yes Historical Provider, MD  Nebivolol HCl (BYSTOLIC PO) Take 1 tablet by mouth daily.   Yes Historical Provider, MD  oxyCODONE (ROXICODONE) 5 MG immediate release tablet Take 1 tablet (5 mg total) by mouth every 6 (six) hours as needed for severe pain. 04/13/14   Audree CamelScott T Rosela Supak, MD   BP 141/97  Pulse 86  Temp(Src) 98.5 F (36.9 C)  Resp 18  Ht 5\' 9"  (1.753 m)  Wt 171 lb (77.565 kg)  BMI 25.24 kg/m2  SpO2 100% Physical Exam  Nursing note and vitals reviewed. Constitutional: He is oriented to person, place, and time. He appears well-developed and well-nourished.  HENT:  Head: Normocephalic and atraumatic.  Right Ear: External ear normal.  Left Ear: External ear normal.  Nose: Nose normal.  Eyes: Right eye exhibits no discharge. Left eye exhibits no discharge.  Neck: Neck supple.  Cardiovascular: Normal rate, regular rhythm, normal heart sounds and intact distal pulses.   Pulmonary/Chest: Effort normal.  Abdominal: Soft. There is  no tenderness.  Genitourinary: Rectal exam shows anal tone normal.  Normal perianal sensation  Musculoskeletal: He exhibits no edema.       Lumbar back: He exhibits tenderness.  Neurological: He is alert and oriented to person, place, and time. He displays no Babinski's sign on the right side. He displays no Babinski's sign on the left side.  Reflex Scores:      Patellar reflexes are 2+ on the right side and 2+ on the left side.      Achilles reflexes are 2+ on the right side and 2+ on the left side. 5/5 strength in LLE, 4/5 strength in RLE. Normal gross sensation.  Skin: Skin is warm and dry.    ED  Course  Procedures (including critical care time) Labs Review Labs Reviewed - No data to display  Imaging Review No results found.   EKG Interpretation None      MDM   Final diagnoses:  Chronic back pain    Patient has not had any new injuries since his most recent x-ray, I do not feel he needs repeat imaging at this time. No alarming symptoms to suggest need for emergent MRI. I have low suspicion for acute spinal cord pathology. Patient has chronic right lower extremity weakness per the notes and himself. Narcotic database review shows that he [facility]. Patient states she's not his PCP despite her being a family practitioner. 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 He states the hydrocodone's don't work for him and does not take them. Given today is Saturday, I will prescribe him with a very small amount of oxycodone and discussed that he needed to get a PCP or follow-up with Dr. Leavy CellaBoyd as soon as possible. Discussed that the ER is not the appropriate place for chronic back pain. Discussed return precautions.    Audree CamelScott T Jadier Rockers, MD 04/13/14 [phone]

## 2014-04-13 NOTE — ED Notes (Signed)
The pt is c/o lower back pain for 2 weeks and he has had 2 back surgeries in the past

## 2014-04-13 NOTE — ED Notes (Signed)
Pt reports he fell down the stairs a week ago. Pt has an MRI scheduled by pcp in 2wks but pain has gotten worse. Pt reports he has been coming here to the ED for medications to manage pain. Pt reports pain 10/10. Pt is ambulatory but states he has a problem with his back when he sits down.

## 2016-07-14 IMAGING — CR DG LUMBAR SPINE COMPLETE 4+V
5 series · 5 of 5 positions shown · non-contrast
Comparison: None.

CLINICAL DATA: Back pain, tailbone pain, fell yesterday going up
stairs, initial evaluation

EXAM:
LUMBAR SPINE - COMPLETE 4+ VIEW

[t l-spine a.p.]
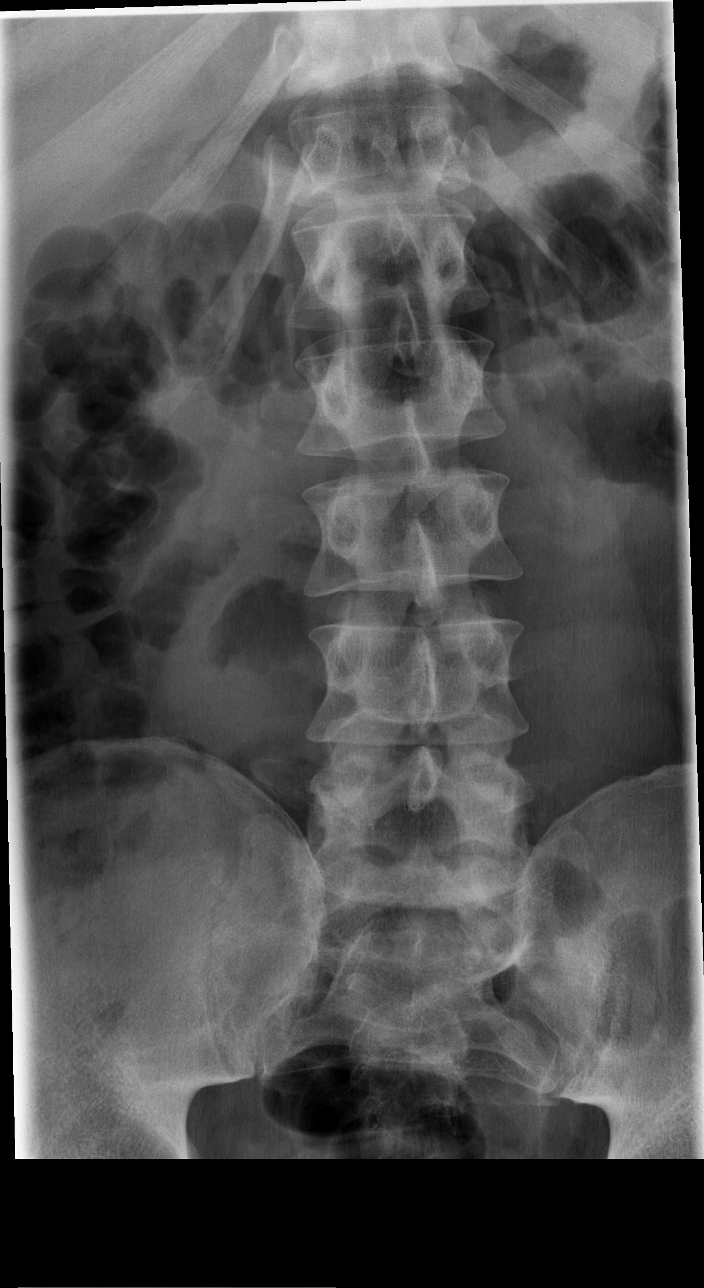

[t l-spine oblique exposure (1 of 2)]
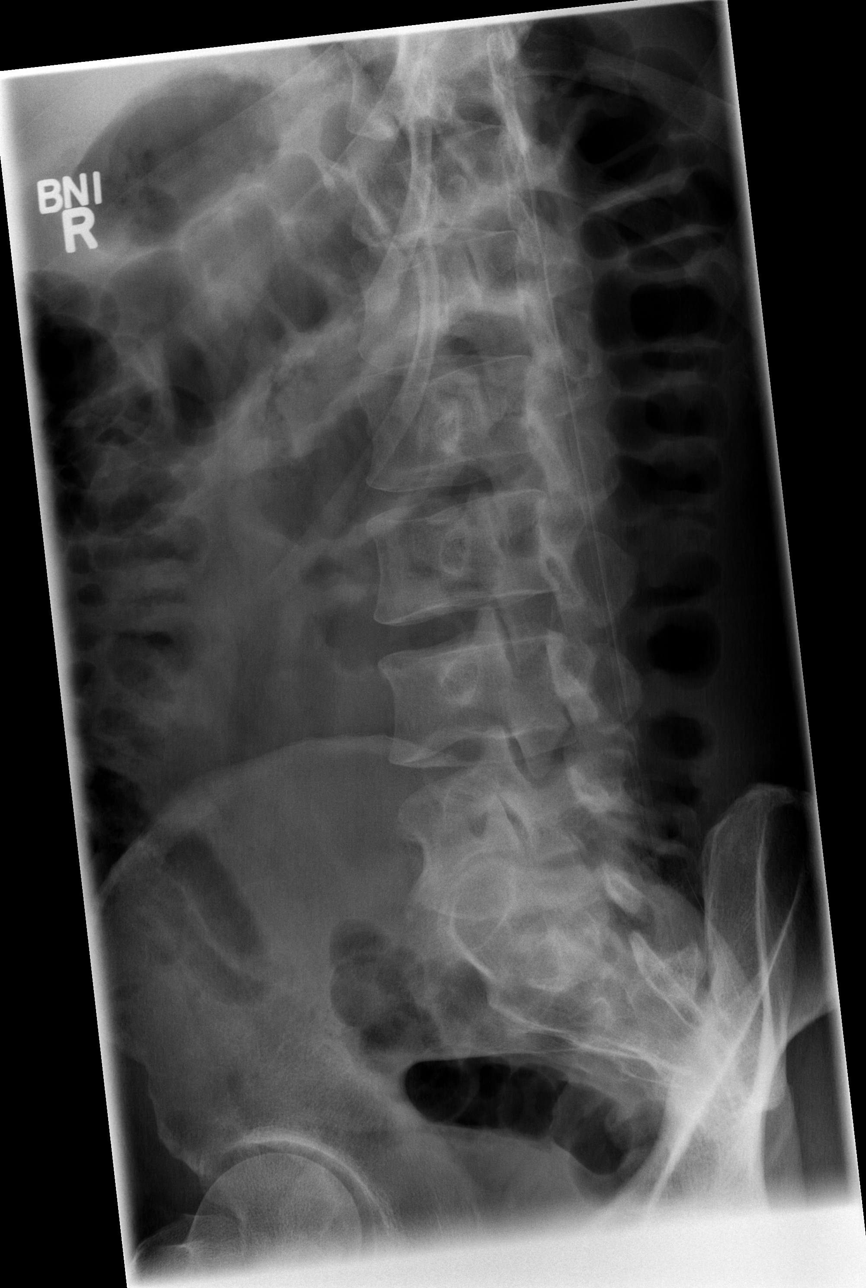

[t l-spine oblique exposure (2 of 2)]
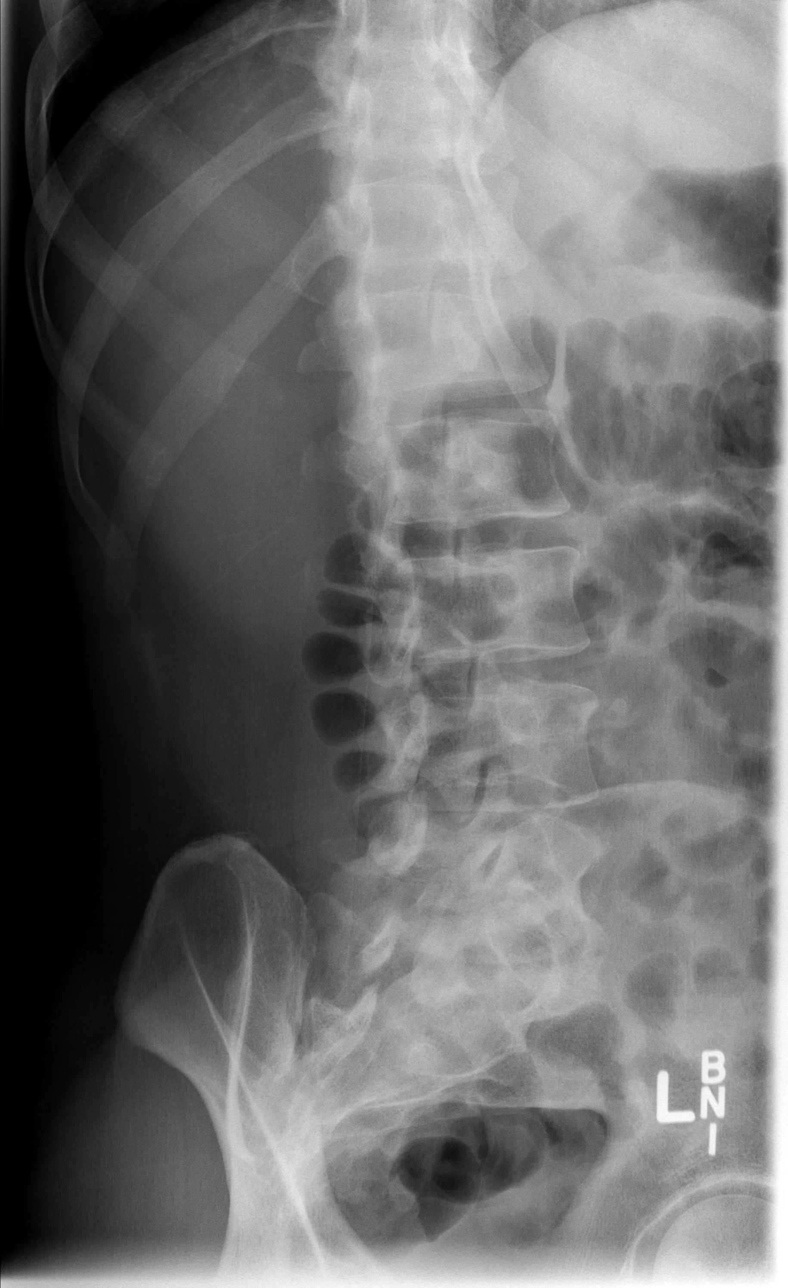

[t l-spine lat]
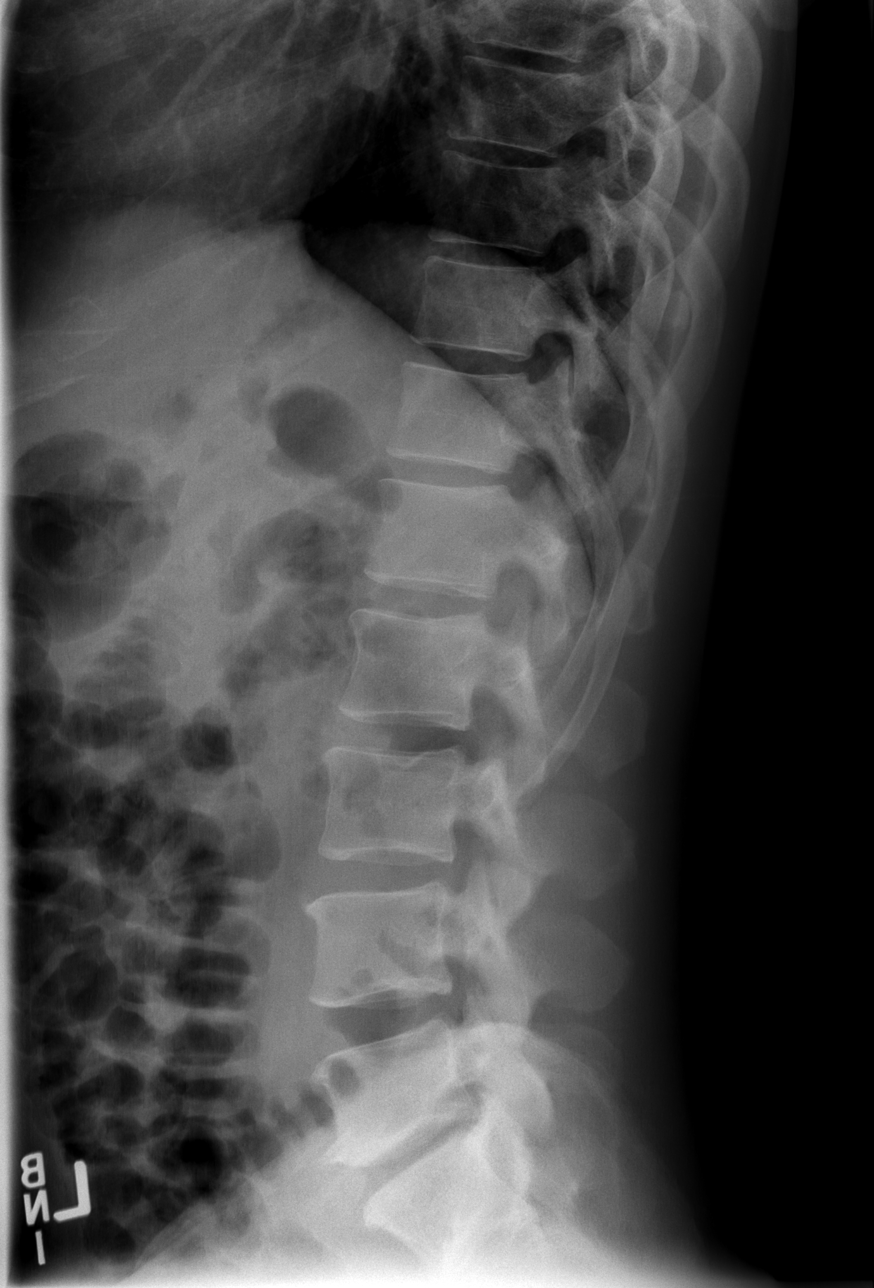

[t l-spine l5-s1 spot]
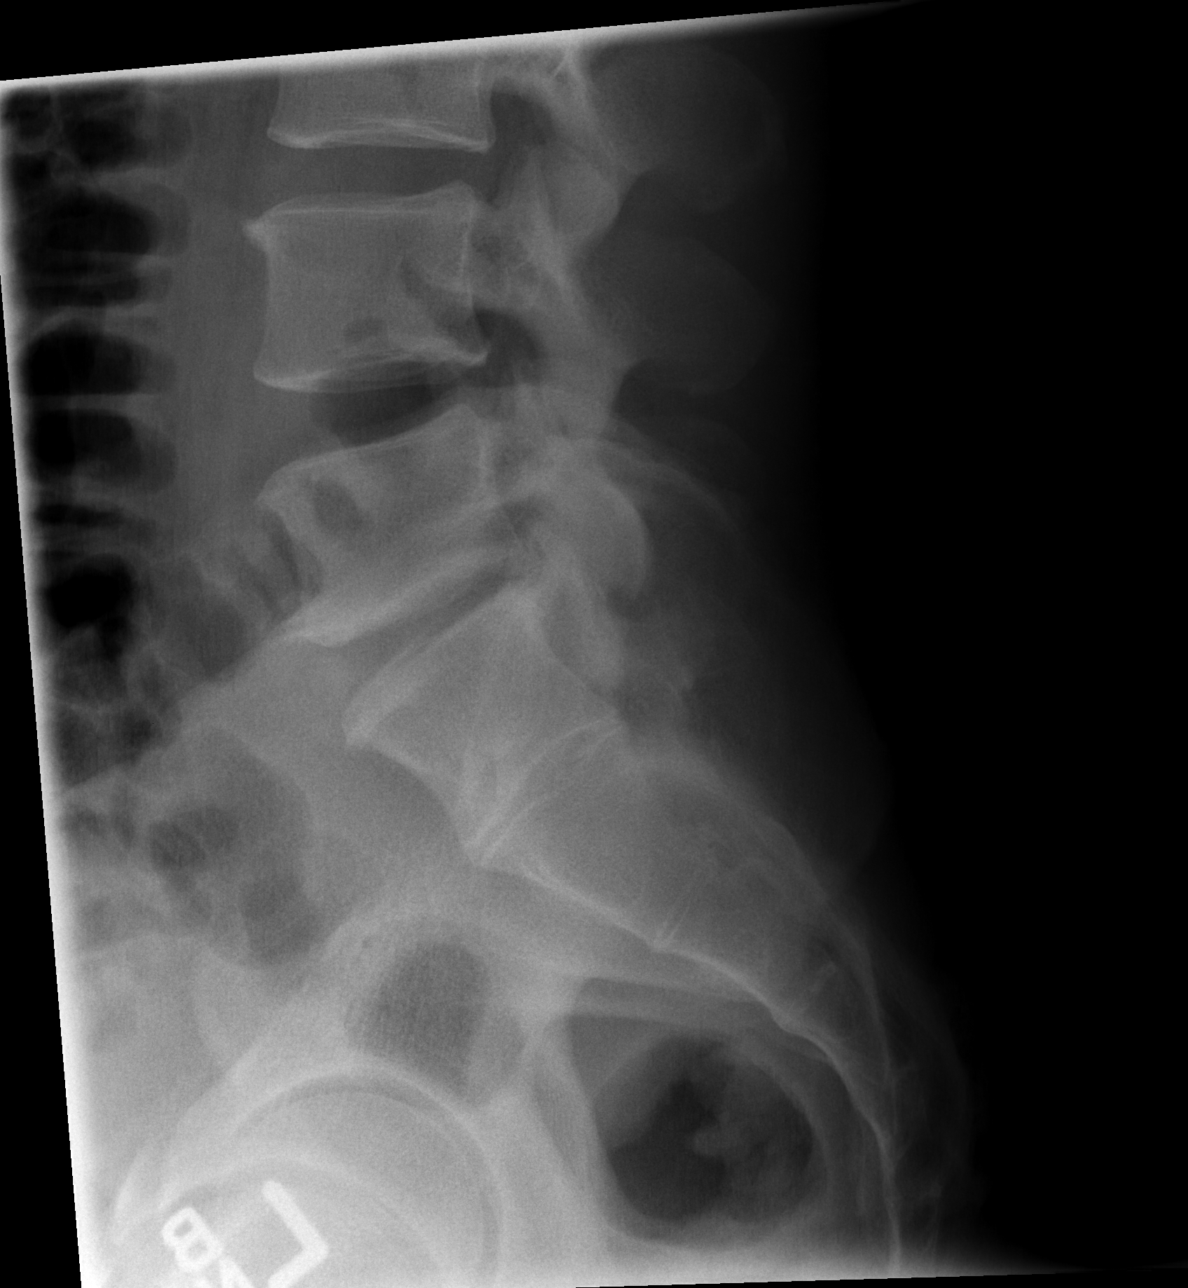

[5 of 5 positions shown; findings below may reference images not displayed]

FINDINGS: Mild levoscoliosis lumbar spine. Normal anterior-posterior
alignment. Mild multilevel degenerative disc disease. No fracture.
IMPRESSION: No acute findings
